# Patient Record
Sex: Female | Born: 1965 | Race: White | Hispanic: No | Marital: Married | Smoking: Current every day smoker
Health system: Southern US, Community
[De-identification: ages and names within clinical notes are randomized; demographics above are authoritative.]

## PROBLEM LIST (undated history)

## (undated) DIAGNOSIS — Z5189 Encounter for other specified aftercare: Secondary | ICD-10-CM

## (undated) DIAGNOSIS — D219 Benign neoplasm of connective and other soft tissue, unspecified: Secondary | ICD-10-CM

## (undated) DIAGNOSIS — D649 Anemia, unspecified: Secondary | ICD-10-CM

## (undated) HISTORY — DX: Anemia, unspecified: D64.9

## (undated) HISTORY — DX: Encounter for other specified aftercare: Z51.89

## (undated) HISTORY — PX: OTHER SURGICAL HISTORY: SHX169

## (undated) HISTORY — PX: BACK SURGERY: SHX140

## (undated) HISTORY — PX: ABDOMINAL HYSTERECTOMY: SHX81

---

## 1997-06-03 ENCOUNTER — Other Ambulatory Visit: Admission: RE | Admit: 1997-06-03 | Discharge: 1997-06-03 | Payer: Self-pay | Admitting: *Deleted

## 1997-07-30 ENCOUNTER — Other Ambulatory Visit: Admission: RE | Admit: 1997-07-30 | Discharge: 1997-07-30 | Payer: Self-pay | Admitting: *Deleted

## 1997-09-01 ENCOUNTER — Other Ambulatory Visit: Admission: RE | Admit: 1997-09-01 | Discharge: 1997-09-01 | Payer: Self-pay | Admitting: *Deleted

## 1999-01-01 ENCOUNTER — Other Ambulatory Visit: Admission: RE | Admit: 1999-01-01 | Discharge: 1999-01-01 | Payer: Self-pay | Admitting: *Deleted

## 2003-09-09 ENCOUNTER — Emergency Department (HOSPITAL_COMMUNITY): Admission: EM | Admit: 2003-09-09 | Discharge: 2003-09-09 | Payer: Self-pay | Admitting: Emergency Medicine

## 2004-04-27 ENCOUNTER — Encounter: Admission: RE | Admit: 2004-04-27 | Discharge: 2004-04-27 | Payer: Self-pay | Admitting: Orthopedic Surgery

## 2004-06-17 ENCOUNTER — Ambulatory Visit: Payer: Self-pay | Admitting: Internal Medicine

## 2004-06-18 ENCOUNTER — Inpatient Hospital Stay (HOSPITAL_COMMUNITY): Admission: RE | Admit: 2004-06-18 | Discharge: 2004-06-22 | Payer: Self-pay | Admitting: Orthopedic Surgery

## 2004-06-18 ENCOUNTER — Encounter (INDEPENDENT_AMBULATORY_CARE_PROVIDER_SITE_OTHER): Payer: Self-pay | Admitting: *Deleted

## 2006-10-15 ENCOUNTER — Encounter: Admission: RE | Admit: 2006-10-15 | Discharge: 2006-10-15 | Payer: Self-pay | Admitting: Orthopedic Surgery

## 2007-02-08 ENCOUNTER — Ambulatory Visit (HOSPITAL_COMMUNITY): Admission: RE | Admit: 2007-02-08 | Discharge: 2007-02-08 | Payer: Self-pay | Admitting: Obstetrics and Gynecology

## 2007-05-06 ENCOUNTER — Emergency Department (HOSPITAL_COMMUNITY): Admission: EM | Admit: 2007-05-06 | Discharge: 2007-05-06 | Payer: Self-pay | Admitting: Emergency Medicine

## 2008-08-30 ENCOUNTER — Encounter: Admission: RE | Admit: 2008-08-30 | Discharge: 2008-08-30 | Payer: Self-pay | Admitting: Orthopedic Surgery

## 2008-09-20 ENCOUNTER — Ambulatory Visit: Payer: Self-pay | Admitting: Diagnostic Radiology

## 2008-09-20 ENCOUNTER — Emergency Department (HOSPITAL_BASED_OUTPATIENT_CLINIC_OR_DEPARTMENT_OTHER): Admission: EM | Admit: 2008-09-20 | Discharge: 2008-09-20 | Payer: Self-pay | Admitting: Emergency Medicine

## 2008-12-17 DIAGNOSIS — Z5189 Encounter for other specified aftercare: Secondary | ICD-10-CM

## 2008-12-17 HISTORY — DX: Encounter for other specified aftercare: Z51.89

## 2008-12-18 ENCOUNTER — Inpatient Hospital Stay (HOSPITAL_COMMUNITY): Admission: AD | Admit: 2008-12-18 | Discharge: 2008-12-26 | Payer: Self-pay | Admitting: Obstetrics & Gynecology

## 2008-12-18 ENCOUNTER — Ambulatory Visit: Payer: Self-pay | Admitting: Obstetrics & Gynecology

## 2008-12-20 ENCOUNTER — Encounter: Payer: Self-pay | Admitting: Family Medicine

## 2008-12-25 ENCOUNTER — Encounter: Payer: Self-pay | Admitting: Interventional Radiology

## 2008-12-26 ENCOUNTER — Ambulatory Visit: Payer: Self-pay | Admitting: Critical Care Medicine

## 2009-01-02 ENCOUNTER — Ambulatory Visit (HOSPITAL_COMMUNITY): Admission: RE | Admit: 2009-01-02 | Discharge: 2009-01-02 | Payer: Self-pay | Admitting: Obstetrics & Gynecology

## 2009-01-07 ENCOUNTER — Ambulatory Visit: Payer: Self-pay | Admitting: Obstetrics & Gynecology

## 2009-01-12 ENCOUNTER — Ambulatory Visit (HOSPITAL_COMMUNITY): Admission: RE | Admit: 2009-01-12 | Discharge: 2009-01-12 | Payer: Self-pay | Admitting: Interventional Radiology

## 2009-08-05 ENCOUNTER — Emergency Department (HOSPITAL_COMMUNITY): Admission: EM | Admit: 2009-08-05 | Discharge: 2009-08-05 | Payer: Self-pay | Admitting: Emergency Medicine

## 2010-02-07 ENCOUNTER — Encounter: Payer: Self-pay | Admitting: Obstetrics & Gynecology

## 2010-04-20 LAB — CBC
HCT: 21.6 % — ABNORMAL LOW (ref 36.0–46.0)
HCT: 25.2 % — ABNORMAL LOW (ref 36.0–46.0)
HCT: 25.2 % — ABNORMAL LOW (ref 36.0–46.0)
Hemoglobin: 8.1 g/dL — ABNORMAL LOW (ref 12.0–15.0)
Hemoglobin: 8.2 g/dL — ABNORMAL LOW (ref 12.0–15.0)
MCHC: 32.2 g/dL (ref 30.0–36.0)
MCHC: 32.5 g/dL (ref 30.0–36.0)
MCHC: 32.6 g/dL (ref 30.0–36.0)
MCHC: 33.4 g/dL (ref 30.0–36.0)
MCV: 72.8 fL — ABNORMAL LOW (ref 78.0–100.0)
MCV: 76.5 fL — ABNORMAL LOW (ref 78.0–100.0)
MCV: 76.9 fL — ABNORMAL LOW (ref 78.0–100.0)
Platelets: 332 10*3/uL (ref 150–400)
Platelets: 407 10*3/uL — ABNORMAL HIGH (ref 150–400)
Platelets: 545 10*3/uL — ABNORMAL HIGH (ref 150–400)
Platelets: 622 10*3/uL — ABNORMAL HIGH (ref 150–400)
RBC: 3.21 MIL/uL — ABNORMAL LOW (ref 3.87–5.11)
RBC: 3.48 MIL/uL — ABNORMAL LOW (ref 3.87–5.11)
RDW: 21.1 % — ABNORMAL HIGH (ref 11.5–15.5)
RDW: 21.1 % — ABNORMAL HIGH (ref 11.5–15.5)
RDW: 23.4 % — ABNORMAL HIGH (ref 11.5–15.5)
WBC: 16.7 10*3/uL — ABNORMAL HIGH (ref 4.0–10.5)
WBC: 8.4 10*3/uL (ref 4.0–10.5)
WBC: 8.7 10*3/uL (ref 4.0–10.5)
WBC: 8.9 10*3/uL (ref 4.0–10.5)

## 2010-04-20 LAB — CULTURE, ROUTINE-ABSCESS
Culture: NO GROWTH
Culture: NO GROWTH

## 2010-04-20 LAB — URINALYSIS, ROUTINE W REFLEX MICROSCOPIC
Bilirubin Urine: NEGATIVE
Nitrite: POSITIVE — AB
Specific Gravity, Urine: 1.015 (ref 1.005–1.030)
Urobilinogen, UA: >8 mg/dL — ABNORMAL HIGH (ref 0.0–1.0)

## 2010-04-20 LAB — CLOSTRIDIUM DIFFICILE EIA
C difficile Toxins A+B, EIA: NEGATIVE
C difficile Toxins A+B, EIA: NEGATIVE

## 2010-04-20 LAB — DIFFERENTIAL
Basophils Absolute: 0 10*3/uL (ref 0.0–0.1)
Basophils Relative: 0 % (ref 0–1)
Basophils Relative: 0 % (ref 0–1)
Lymphs Abs: 0.7 10*3/uL (ref 0.7–4.0)
Monocytes Absolute: 0.7 10*3/uL (ref 0.1–1.0)
Monocytes Absolute: 0.9 10*3/uL (ref 0.1–1.0)
Monocytes Relative: 7 % (ref 3–12)
Neutro Abs: 15.2 10*3/uL — ABNORMAL HIGH (ref 1.7–7.7)
Neutro Abs: 7.5 10*3/uL (ref 1.7–7.7)
Neutrophils Relative %: 91 % — ABNORMAL HIGH (ref 43–77)

## 2010-04-20 LAB — BASIC METABOLIC PANEL
BUN: 6 mg/dL (ref 6–23)
Chloride: 99 mEq/L (ref 96–112)
GFR calc non Af Amer: >60 mL/min (ref >60)
Glucose, Bld: 155 mg/dL — ABNORMAL HIGH (ref 70–99)
Potassium: 3.1 mEq/L — ABNORMAL LOW (ref 3.5–5.1)

## 2010-04-20 LAB — ANAEROBIC CULTURE

## 2010-04-20 LAB — COMPREHENSIVE METABOLIC PANEL
ALT: 12 U/L (ref 0–35)
AST: 15 U/L (ref 0–37)
Albumin: 2.2 g/dL — ABNORMAL LOW (ref 3.5–5.2)
Alkaline Phosphatase: 57 U/L (ref 39–117)
BUN: 9 mg/dL (ref 6–23)
CO2: 25 mEq/L (ref 19–32)
CO2: 32 mEq/L (ref 19–32)
Calcium: 8.3 mg/dL — ABNORMAL LOW (ref 8.4–10.5)
Chloride: 101 mEq/L (ref 96–112)
Chloride: 99 mEq/L (ref 96–112)
GFR calc Af Amer: >60 mL/min (ref >60)
GFR calc non Af Amer: >60 mL/min (ref >60)
GFR calc non Af Amer: >60 mL/min (ref >60)
Glucose, Bld: 143 mg/dL — ABNORMAL HIGH (ref 70–99)
Potassium: 3.8 mEq/L (ref 3.5–5.1)
Sodium: 138 mEq/L (ref 135–145)
Total Bilirubin: 0.5 mg/dL (ref 0.3–1.2)

## 2010-04-20 LAB — URINE MICROSCOPIC-ADD ON

## 2010-04-20 LAB — CROSSMATCH

## 2010-04-20 LAB — PROTIME-INR
INR: 1.46 (ref 0.00–1.49)
Prothrombin Time: 17.6 seconds — ABNORMAL HIGH (ref 11.6–15.2)

## 2010-04-20 LAB — APTT: aPTT: 54 seconds — ABNORMAL HIGH (ref 24–37)

## 2010-04-20 LAB — CULTURE, BLOOD (ROUTINE X 2)
Culture: NO GROWTH
Culture: NO GROWTH

## 2010-04-20 LAB — URINE CULTURE
Colony Count: NO GROWTH
Culture: NO GROWTH

## 2010-06-04 NOTE — Discharge Summary (Signed)
NAMECAMESHIA, CRESSMAN                 ACCOUNT NO.:  [id number]   MEDICAL RECORD NO.:  [id number]          PATIENT TYPE:  INP   LOCATION:  5031                         FACILITY:  [facility]   PHYSICIAN:  Nelda Severe, MD      DATE OF BIRTH:  1965-08-03   DATE OF ADMISSION:  06/18/2004  DATE OF DISCHARGE:  06/22/2004                                 DISCHARGE SUMMARY   HISTORY AND HOSPITAL COURSE:  This woman was admitted for management of  diskogenic pain at L5-S1.  The patient day of admission she was taken to the  operating room where left L5-S1 disk excision was carried out.  Posterior  interbody fusion with cages was carried out as well as posterolateral  fusion.  Pedicle screws and rods were used.  Postoperatively she had no  complications.  She mobilized satisfactorily.  Ileus resolved.   At the time of discharge, she is afebrile.  She is ambulatory with a walker.  She has some dry drainage on the upper portion of her gauze dressing which  is being changed.  She has been instructed she may remove her dressing  tomorrow and shower.  She is to avoid lifting and bending.  She may use a  walker as long as she needs to and her postoperative physical therapy is  walking.   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   CONDITION ON DISCHARGE:  Wound stable, ambulatory.   DISCHARGE MEDICATIONS:  1.  Percocet 5/325 (100 tablets) one to two q.6h. p.r.n.  2.  Flexeril 10 mg one q.6h. p.r.n. spasm, 50 tablets.   DISCHARGE INSTRUCTIONS:  No bending or lifting.  Followup arrangements are  in one month.       MT/MEDQ  D:  06/22/2004  T:  06/22/2004  Job:  [id number]

## 2010-06-04 NOTE — Op Note (Signed)
Gabrielle Walker, [name]                 ACCOUNT NO.:  [id number]   MEDICAL RECORD NO.:  [id number]          PATIENT TYPE:  INP   LOCATION:  2899                         FACILITY:  [facility]   PHYSICIAN:  Nelda Severe, MD      DATE OF BIRTH:  [birth year]   DATE OF PROCEDURE:  06/18/2004  DATE OF DISCHARGE:                                 OPERATIVE REPORT   PREOPERATIVE DIAGNOSIS:  Lumbar disk degeneration and disk herniation, L5-  S1.   POSTOPERATIVE DIAGNOSIS:  Lumbar disk degeneration and disk herniation, L5-  S1.   OPERATION PERFORMED:  Bilateral L5 laminectomy (right L5-S1 for disk  protrusion); posterior interbody fusion at L5-S1; posterolateral fusion at  L5-S1; insertion of pedicle screws at L5-S1 with rods (Synthes ClickX);  placement of interbody cages at L5-S1 (Synthes); application of InFuse (bone  morphogenic protein) to interbody fusion and posterolateral graft; local  harvest of bone graft; neurologic monitoring.   SURGEON:  Nelda Severe, MD   ASSISTANT:  Lynford Citizen, RN   ANESTHESIA:  General endotracheal.   DESCRIPTION OF PROCEDURE:  The patient was placed under general endotracheal  anesthesia.  A Foley catheter was placed in the bladder.  Bilateral  sequential compression devices were attached to the lower extremities.  Electrodes for neurologic monitoring were applied to upper and lower  extremities as well as the scalp.  The patient was positioned prone on an  Acromed frame, positioned on the Brielle table.  The upper extremities were  carefully positioned so as to avoid hyperflexion and abduction of the  shoulders and hyperflexion of the elbows.  The upper extremities were  padded. There was no pressure on the cubital tunnels.  Hips and knees were  gently flexed.  The legs were supported on pillows.   The lumbar area was prepped with DuraPrep and draped in rectangular fashion.  The drapes were secured with Ioban.  A midline incision was made over the  lumbosacral level through a rather elaborate tattoo.  Subcutaneous tissue  and paraspinal fascia was injected with a mixture of 0.25% Marcaine with  epinephrine and 1% plain lidocaine.   Dissection was carried down to the spinous processes and paraspinal muscles  reflected bilaterally from the transverse process of L5 to the ala of the  sacrum.  Cross-table lateral radiograph was taken with the Kocher on the L4  spinous process which confirmed our level superior.   We then first made the holes of pedicles of L5 and L1 Bilaterally.  This was  done in the same manner in each instance.  The piece of the base of the  superior articular process was removed with a Leksell rongeur at its  junction with the pars interarticularis.  The posterior pedicle was  identified and perforated with an awl.  A 3.5 mm drill bit was then used to  make a hole through the pedicle into the vertebral body.  The holes were  injected with FlowSeal and a radiopaque marker was placed in each hole.  A  cross-table lateral radiograph was taken.  Prior to placing the markers,  each  hole was carefully probed with a ball tip probe to make sure it was  circumferentially intact and sounded for the depth and the depth measured.   The cross-table lateral radiograph was somewhat difficult to interpret as it  was not a true lateral at the lower two segments.  It made it appear as  though one of the markers was in the L5-S1 disk space which it appeared not  to be based on careful probing.   Next, we performed a bilateral laminectomy at L5 including of the inferior  facet joints at L5 and the superior facet joints at S1.  When this had been  done, the ligamentum flavum was removed from the dorsal aspect of the cauda  equina and ligamentum flavum and facet joint capsule were removed laterally.  Epidural veins in the neural foramen were controlled using bipolar  coagulation.   Prior to proceeding with the interbody fusion, we  inserted the screws.  These were 6.2 mm diameter screws at the sacrum and L5.  Each hole was  carefully probed again.  A drill bit was placed in the hole and stimulated  and all EMG recordings were at levels above threshold. The screws were then  placed, length based on depth measurements at each level and again  stimulated and again EMG activation distally was at above threshold levels  of current.  Also we had the opportunity to palpate the medial pedicles of  L5 and S1 and could palpate no evidence of perforation.   Next, the dura was retracted medially and annulectomy was performed  bilaterally in sequence.  Enucleation of the disk was then carried out with  disk curettes and rongeurs, etc.  Disk spacer was placed and this suggested  that a 13 mm spacer would be the appropriate thickness.  After careful  curettage of the end plates down to bleeding bone, we then placed a piece of  collagen sponge which had been saturated with BMP anteriorly in the disk  space.  Loose graft was then placed.  We then inserted 13 mm spacers  containing morcellized graft on either side.  This was done well distracting  on the screws so that no forcible impaction had to be carried out.   Not mentioned above is that local graft was harvested by using an acetabular  reamer to ream away partial thickness of the lamina of L5 as well as the  inferior articular processes.  The rest which was removed piecemeal was  morcellized in a bone mill.   Subsequently, the ala of the sacrum bilaterally and the transverse processes  bilaterally were decorticated.  The remaining graft was divided in two and  half packed in on either side.  This was augmented with collagen sponges  soaked with BMP on both sides.   Then we attached the couplings to the Synthes ClickX screws and placed 45 mm  rods which were then coupled to the screws and torqued after compression of  the construct bilaterally.  Radiographs were then taken.   Once again there was problem of obliquity,  making it appear as though one of the screws was in the L5-S1 disk space,  but I am quite confident this is not the case in view of the fact that I  could both visualize the disk space, while it was distracted, and palpate  carefully and there was no evidence of any screw intrusion prior to  insertion of the cages.   Once again there was no stimulation  of nerves by stimulation of the screw at  levels below threshold.  Sponge and needle counts were correct.  Blood loss  was estimated at 250 cc.   The patient is currently awakening in her bed in the operating room and is  moving both lower extremities including dorsiflexing and plantar flexing  both feet and ankles.  There is good dorsiflexion of both right and left  feet as well as plantar flexion and knee flexion bilaterally.       MT/MEDQ  D:  06/18/2004  T:  06/19/2004  Job:  [id number]

## 2010-08-01 ENCOUNTER — Encounter (HOSPITAL_COMMUNITY): Payer: Self-pay | Admitting: *Deleted

## 2010-08-01 ENCOUNTER — Ambulatory Visit (HOSPITAL_COMMUNITY)
Admission: EM | Admit: 2010-08-01 | Discharge: 2010-08-01 | Disposition: A | Discharge status: Home / Self Care | Payer: Medicaid Other | Attending: Obstetrics and Gynecology | Admitting: Obstetrics and Gynecology

## 2010-08-01 ENCOUNTER — Inpatient Hospital Stay (HOSPITAL_COMMUNITY)
Admission: AD | Admit: 2010-08-01 | Discharge: 2010-08-01 | Disposition: A | Discharge status: Home / Self Care | Payer: Medicaid Other | Source: Ambulatory Visit | Attending: Obstetrics and Gynecology | Admitting: Obstetrics and Gynecology

## 2010-08-01 ENCOUNTER — Inpatient Hospital Stay (HOSPITAL_COMMUNITY): Payer: Medicaid Other

## 2010-08-01 DIAGNOSIS — R109 Unspecified abdominal pain: Secondary | ICD-10-CM | POA: Insufficient documentation

## 2010-08-01 DIAGNOSIS — R1032 Left lower quadrant pain: Secondary | ICD-10-CM

## 2010-08-01 DIAGNOSIS — N83299 Other ovarian cyst, unspecified side: Secondary | ICD-10-CM

## 2010-08-01 DIAGNOSIS — N83209 Unspecified ovarian cyst, unspecified side: Secondary | ICD-10-CM | POA: Insufficient documentation

## 2010-08-01 HISTORY — DX: Benign neoplasm of connective and other soft tissue, unspecified: D21.9

## 2010-08-01 LAB — COMPREHENSIVE METABOLIC PANEL
ALT: 15 U/L (ref 0–35)
AST: 16 U/L (ref 0–37)
Albumin: 3.8 g/dL (ref 3.5–5.2)
CO2: 26 mEq/L (ref 19–32)
Chloride: 99 mEq/L (ref 96–112)
GFR calc non Af Amer: >60 mL/min (ref >60)
Potassium: 4.8 mEq/L (ref 3.5–5.1)
Sodium: 136 mEq/L (ref 135–145)
Total Bilirubin: 0.3 mg/dL (ref 0.3–1.2)

## 2010-08-01 LAB — CBC
Platelets: 336 10*3/uL (ref 150–400)
RBC: 5.08 MIL/uL (ref 3.87–5.11)
RDW: 14.1 % (ref 11.5–15.5)
WBC: 13.2 10*3/uL — ABNORMAL HIGH (ref 4.0–10.5)

## 2010-08-01 LAB — URINALYSIS, ROUTINE W REFLEX MICROSCOPIC
Bilirubin Urine: NEGATIVE
Glucose, UA: NEGATIVE mg/dL
Specific Gravity, Urine: <1.005 — ABNORMAL LOW (ref 1.005–1.030)
Urobilinogen, UA: 0.2 mg/dL (ref 0.0–1.0)

## 2010-08-01 LAB — URINE MICROSCOPIC-ADD ON

## 2010-08-01 MED ORDER — GADOBENATE DIMEGLUMINE 529 MG/ML IV SOLN
13.0000 mL | Freq: Once | INTRAVENOUS | Status: AC | PRN
  Administered 2010-08-01: 13 mL via INTRAVENOUS

## 2010-08-01 MED ORDER — KETOROLAC TROMETHAMINE 60 MG/2ML IM SOLN
60.0000 mg | Freq: Once | INTRAMUSCULAR | Status: AC
  Administered 2010-08-01: 60 mg via INTRAMUSCULAR
  Filled 2010-08-01: qty 2

## 2010-08-01 MED ORDER — HYDROCODONE-ACETAMINOPHEN 5-325 MG PO TABS
2.0000 | ORAL_TABLET | ORAL | Status: AC
  Administered 2010-08-01: 2 via ORAL
  Filled 2010-08-01: qty 2

## 2010-08-01 MED ORDER — SODIUM CHLORIDE 0.9 % IJ SOLN
INTRAMUSCULAR | Status: AC
  Administered 2010-08-01: 3 mL
  Filled 2010-08-01: qty 3

## 2010-08-01 MED ORDER — IBUPROFEN 200 MG PO TABS: 800.0000 mg | ORAL_TABLET | Freq: Three times a day (TID) | ORAL | Status: AC | PRN

## 2010-08-01 MED ORDER — HYDROCODONE-ACETAMINOPHEN 5-325 MG PO TABS: 1.0000 | ORAL_TABLET | Freq: Four times a day (QID) | ORAL | Status: AC | PRN

## 2010-08-01 NOTE — ED Provider Notes (Signed)
History     Chief Complaint  Patient presents with  . Abdominal Pain   HPI  LLQ pain for 3 days.  Worsening course.  Hx of partial hysterectomy (still has ovaries) and had a LLQ pelvic abcess following surgery.  States pain today feels much like that in 2010.  Is very worried and tearful.  Surgery was done in [place].    OB History    Grav Para Term Preterm Abortions TAB SAB Ect Mult Living   5 3 3  2  2   3       Past Medical History  Diagnosis Date  . Fibroid tumor     Past Surgical History  Procedure Date  . Abdominal hysterectomy   . Uterine polyps   . Back surgery     Family History  Problem Relation Age of Onset  . Cancer Mother   . Heart disease Father     History  Substance Use Topics  . Smoking status: Current Everyday Smoker -- 1.0 packs/day for 30 years  . Smokeless tobacco: Not on file  . Alcohol Use: No    Allergies: No Known Allergies  Prescriptions prior to admission  Medication Sig Dispense Refill  . HYDROcodone-acetaminophen (NORCO) 5-325 MG per tablet Take 1 tablet by mouth every 6 (six) hours as needed. For pain.         ROS Physical Exam   Blood pressure 129/78, pulse 94, temperature 98.9 F (37.2 C), temperature source Oral, resp. rate 18, height 5\' 7"  (1.702 m), weight 142 lb 9.6 oz (64.683 kg).  Physical Exam  Nursing note and vitals reviewed. Constitutional: She is oriented to person, place, and time. She appears well-developed and well-nourished.  HENT:  Head: Normocephalic.  Eyes: EOM are normal.  Neck: Neck supple.  GI:       Lower abdomen slightly distended in appearance.  At time of exam, client was crying.  Limited exam done and then ultrasound was ready for her.  Neurological: She is alert and oriented to person, place, and time.  Skin: Skin is warm and dry.  Psychiatric:       Anxious and tearful    MAU Course  Procedures  *RADIOLOGY REPORT*  Clinical Data: Left lower quadrant abdominal pain. History of  partial  hysterectomy left pelvic drainages for abscesses in 2010  TRANSABDOMINAL AND TRANSVAGINAL ULTRASOUND OF PELVIS  Technique: Both transabdominal and transvaginal ultrasound  examinations of the pelvis were performed. Transabdominal technique  was performed for global imaging of the pelvis including uterus,  ovaries, adnexal regions, and pelvic cul-de-sac.  Comparison: 01/12/2009  It was necessary to proceed with endovaginal exam following the  transabdomnial exam to visualize the adnexal lesion.  Findings:  Uterus: Surgically absent  Endometrium: Surgically absent  Right ovary: Not visualized  Left ovary: Complex 10.9 x 7.0 by 7.3 cm left adnexal structure  noted, inseparable from left ovarian tissue (which is not readily  recognizable). There appear to be higher and lower echogenicity  components, possibly reflecting a hemorrhagic ovarian cyst, ovarian  mass, or conceivably even a torsed ovary. Doppler assessment was  not performed but given that normal ovarian tissue is difficult to  identify, I am not certain that attempts at Doppler assessment will  be helpful. We do not demonstrate significant flow within the  large left adnexal cystic mass.  Other findings: No free fluid  IMPRESSION:  1. Large complex cystic lesion in the left adnexa, in the expected  vicinity of the left ovary  although normal left ovarian tissue is  not well seen. This could represent a complex/hemorrhagic cyst,  and mass, abscess, or possibly even a torsed ovary. However, given  the lack of associated free fluid, entities such as abscess or  torsed ovary are less favored. If further imaging workup is  required, MRI scan the best chance of providing additional  specificity.  2. Prior hysterectomy.  3. Right ovary was not visualized.  4. No free pelvic fluid identified.   MDM   Consult with Dr. Jolayne Panther. Will get MRI today.  Will need pain management and follow up in GYN clinic. Client has been seen in GYN  clinic previously.  Was managed with drains in pelvic abcess after hysterectomy done in [place] in 2010.  Saw Dr. Penne Lash in GYN clinic in 2010.  2000 - client currently at [facility] for MRI.  Had Vicodin 10/325 prior to transport for pain management.   Nolene Bernheim, NP 08/01/10 2007 ,  MRI showed left adnexal mass, most likely hemorraghic cyst. Per Dr. Jolayne Panther, d/c home with pain meds, follow up in clinic in 4-6 weeks, precautions rev'd.

## 2010-08-01 NOTE — Progress Notes (Addendum)
Onset of abdominal pain in lower abdomen primarily left side has had partial hysterectomy 2010 had abscess and hematoma

## 2010-08-01 NOTE — Progress Notes (Signed)
Pt had partial hysterectomy in 2010, had complications & had inpt stay here after surgery.  Lower abd pain since Friday, pain is more severe, "starting to burn."  Pain is mostly on L side, some dysuria & pressure.

## 2010-08-03 LAB — GC/CHLAMYDIA PROBE AMP, URINE: Chlamydia, Swab/Urine, PCR: NEGATIVE

## 2010-08-28 IMAGING — CR DG CHEST 2V
2 series · 2 of 2 positions shown · non-contrast
Comparison: 09/20/2008

CLINICAL DATA: Postoperative abdominal pain.  Evaluate for pleural
fluid

CHEST - 2 VIEW

[view not recorded (1 of 2)]
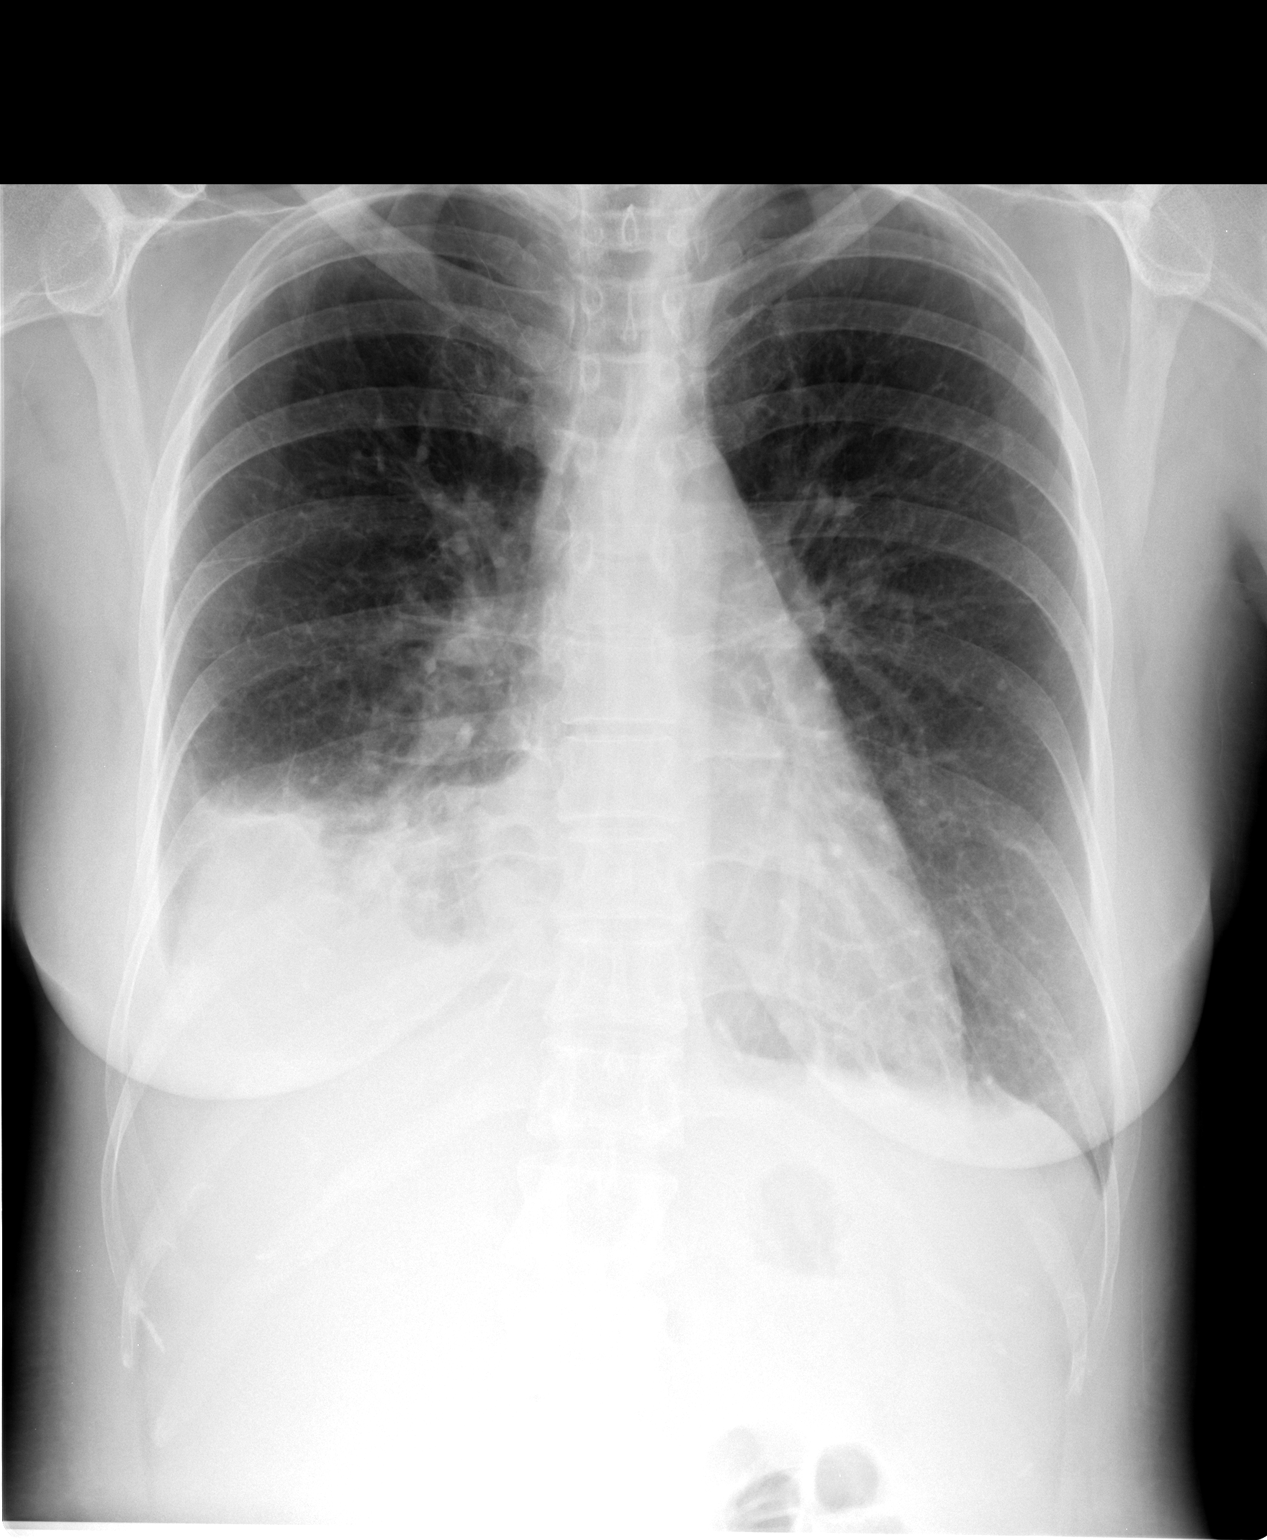

[view not recorded (2 of 2)]
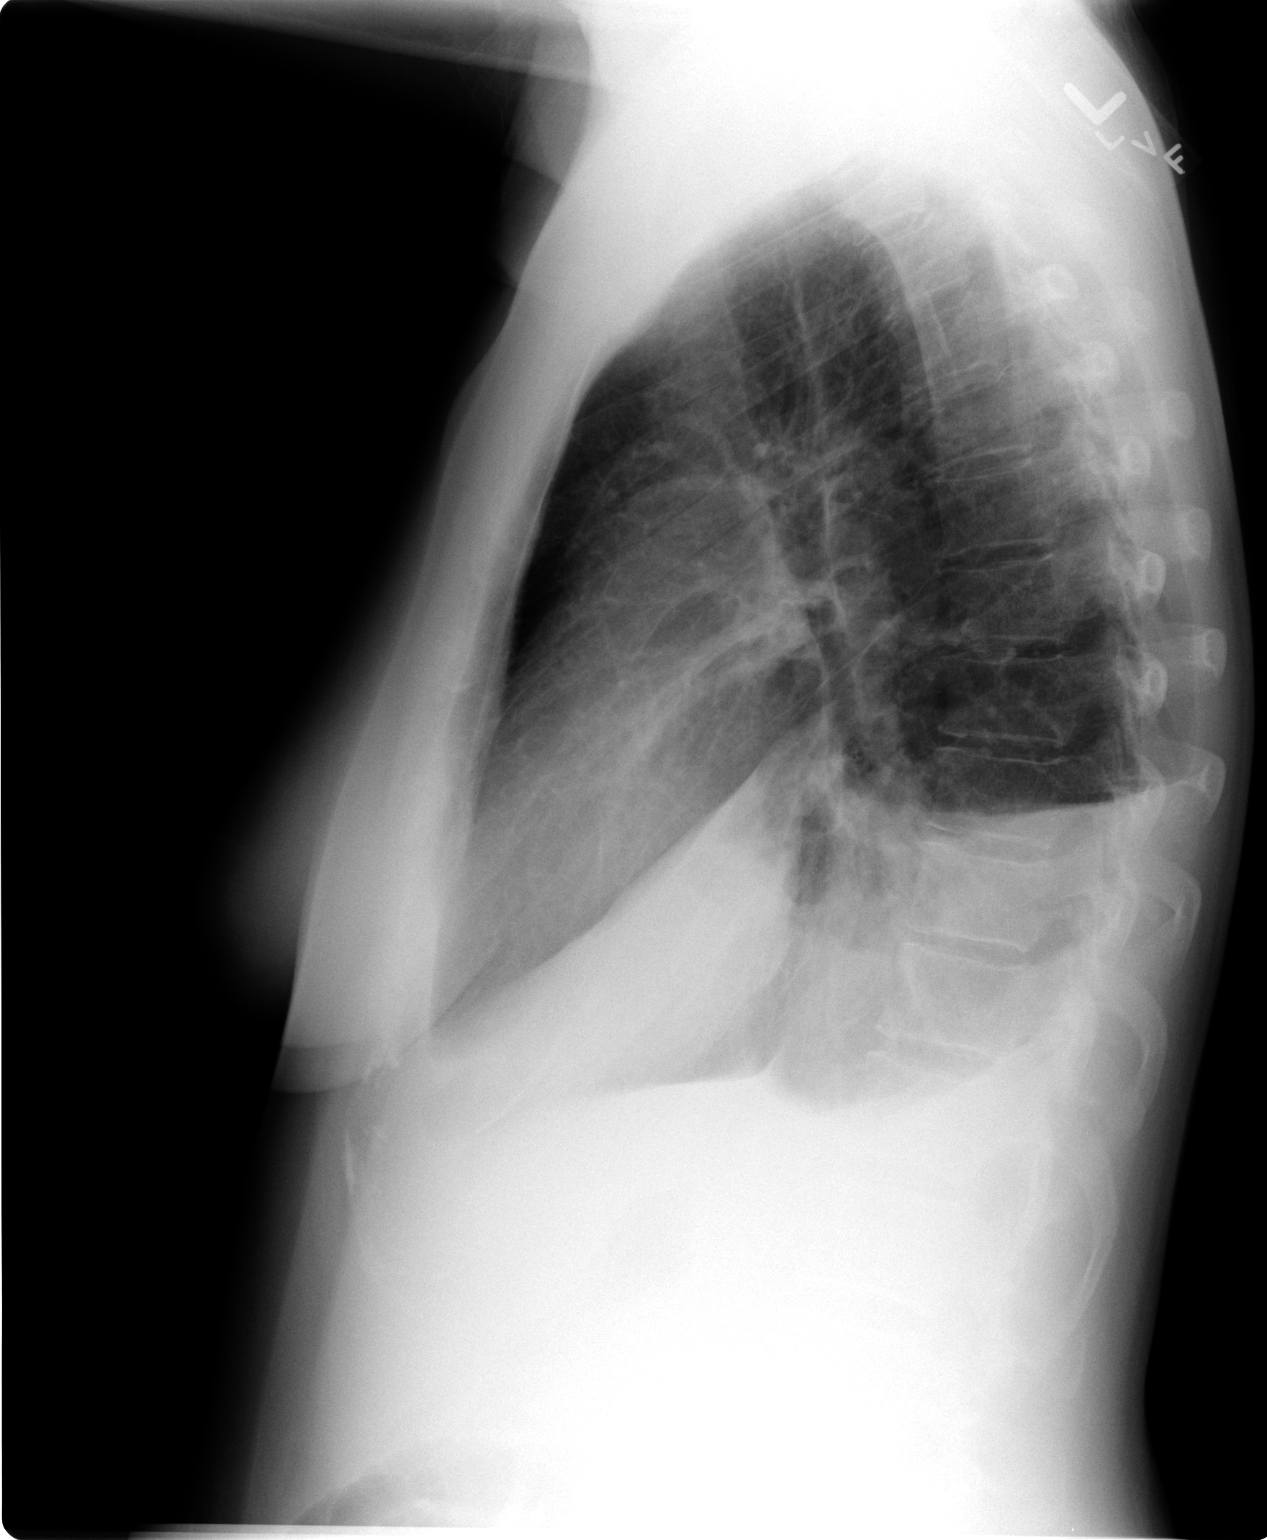

[2 of 2 positions shown; findings below may reference images not displayed]

FINDINGS: Heart and mediastinal contours appear within normal
limits.  There are bilateral pleural effusions right greater than
left noted.  The visualized portion of the lung fields appear clear
with the right base obscured by the described pleural effusion.
This is better evaluated on subsequent decubitus views.  Bony
structures appear intact.
IMPRESSION: Bilateral pleural effusions with obscured right lung base.

## 2010-09-06 ENCOUNTER — Encounter: Payer: Self-pay | Admitting: Obstetrics and Gynecology

## 2010-09-06 ENCOUNTER — Ambulatory Visit (INDEPENDENT_AMBULATORY_CARE_PROVIDER_SITE_OTHER): Payer: Medicaid Other | Admitting: Obstetrics and Gynecology

## 2010-09-06 VITALS — BP 135/92 | HR 84 | Temp 99.1°F | Ht 67.0 in | Wt 143.7 lb

## 2010-09-06 DIAGNOSIS — N83209 Unspecified ovarian cyst, unspecified side: Secondary | ICD-10-CM

## 2010-09-06 MED ORDER — HYDROCODONE-ACETAMINOPHEN 5-500 MG PO TABS: 1.0000 | ORAL_TABLET | ORAL | Status: AC | PRN

## 2010-09-06 NOTE — Progress Notes (Signed)
  Subjective:    Patient ID: Gabrielle Walker, female    DOB: 06-Nov-1965, 44 y.o.   MRN: [id number]  HPI 45 yo [id number] s/p LAVH in November 2010 complicated by pelvic abscess requiring pelvic drain placement by IR presented today as an MAU follow-up. Patient was seen on 7/15 secondary to pelvic pain where an ultrasound and MRI demonstrated the presence of a 10 cm left ovarian cyst. Patient reports that since that time her pain has significantly improved. Patient stated that she experiences the pain in her LLQ with excessive bending forward or during intercourse but it is not as severe as it was during her MAU admission. Patient is hopeful that the cyst has resolved and no further intervention will be needed. Patient is otherwise without any other complaints   Review of Systems  All other systems reviewed and are negative.       Objective:   Physical Exam GENERAL: Well-developed, well-nourished female in no acute distress.  ABDOMEN: Soft, nontender, nondistended. No organomegaly. PELVIC: Normal external female genitalia. Vagina is pink and rugated.  Vaginal vault intact. No palpable masses. Tenderness in LLQ.  EXTREMITIES: No cyanosis, clubbing, or edema, 2+ distal pulses.       Assessment & Plan:  45 yo G5 [id number] with 10 cm left ovarian cyst - In view of improvement of pelvic pain, repeat ultrasound in ordered - Rx. Vicodin provided  - Ca-125 also ordered - Patient counseled on the need for surgical intervention in the event that the cyst is unchanged or increased in size. Patient verbalized understanding. All questions were answered.

## 2010-09-06 NOTE — Patient Instructions (Signed)
Ovarian Cyst   The ovaries are small organs that are on each side of the uterus. The ovaries are the organs that produce the female hormones, estrogen and progesterone. An ovarian cyst is a sac filled with fluid that can vary in its size. It is normal for a small cyst to form in women who are in the childbearing age and who have menstrual periods. This type of cyst is called a follicle cyst that becomes an ovulation cyst (corpus luteum cyst) after it produces the women’s egg. It later goes away on its own if the woman does not become pregnant. There are other kinds of ovarian cysts that may cause problems and may need to be treated. The most serious problem is a cyst with cancer. It should be noted that menopausal women who have an ovarian cyst are at a higher risk of it being a cancer cyst. They should be evaluated very quickly, thoroughly and followed closely. This is especially true in menopausal women because of the high rate of ovarian cancer in women in menopause.   CAUSES AND TYPES OF OVARIAN CYSTS:   FUNCTIONAL CYST: The follicle/corpus luteum cyst is a functional cyst that occurs every month during ovulation with the menstrual cycle. They go away with the next menstrual cycle if the woman does not get pregnant. Usually, there are no symptoms with a functional cyst.   ENDOMETRIOMA CYST: This cyst develops from the lining of the uterus tissue. This cyst gets in or on the ovary. It grows every month from the bleeding during the menstrual period. It is also called a “chocolate cyst” because it becomes filled with blood that turns brown. This cyst can cause pain in the lower abdomen during intercourse and with your menstrual period.   CYSTADENOMA CYST: This cyst develops from the cells on the outside of the ovary. They usually are not cancerous. They can get very big and cause lower abdomen pain and pain with intercourse. This type of cyst can twist on itself, cut off its blood supply and cause severe pain. It  also can easily rupture and cause a lot of pain.   DERMOID CYST: This type of cyst is sometimes found in both ovaries. They are found to have different kinds of body tissue in the cyst. The tissue includes skin, teeth, hair, and/or cartilage. They usually do not have symptoms unless they get very big. Dermoid cysts are rarely cancerous.   POLYCYSTIC OVARY: This is a rare condition with hormone problems that produces many small cysts on both ovaries. The cysts are follicle-like cysts that never produce an egg and become a corpus luteum. It can cause an increase in body weight, infertility, acne, increase in body and facial hair and lack of menstrual periods or rare menstrual periods. Many women with this problem develop type 2 diabetes. The exact cause of this problem is unknown. A polycystic ovary is rarely cancerous.   THECA LUTEIN CYST: Occurs when too much hormone (human chorionic gonadotropin) is produced and over-stimulates the ovaries to produce an egg. They are frequently seen when doctors stimulate the ovaries for invitro-fertilization (test tube babies).   LUTEOMA CYST: This cyst is seen during pregnancy. Rarely it can cause an obstruction to the birth canal during labor and delivery. They usually go away after delivery.   SYMPTOMS   Pelvic pain or pressure.   Pain during sexual intercourse.   Increasing girth (swelling) of the abdomen.   Abnormal menstrual periods.   Increasing pain with menstrual   periods.   You stop having menstrual periods and you are not pregnant.   DIAGNOSIS   The diagnosis can be made during:   Routine or annual pelvic examination (common).   Ultrasound.   X-ray of the pelvis.   CT Scan.   MRI.   Blood tests.   TREATMENT   Treatment may only be to follow the cyst monthly for 2 to 3 months with your caregiver. Many go away on their own, especially functional cysts.   May be aspirated (drained) with a long needle with ultrasound, or by laparoscopy (inserting a tube into the pelvis  through a small incision).   The whole cyst can be removed by laparoscopy.   Sometimes the cyst may need to be removed through an incision in the lower abdomen.   Hormone treatment is sometimes used to help dissolve certain cysts.   Birth control pills are sometimes used to help dissolve certain cysts.   HOME CARE INSTRUCTIONS   Follow your caregiver's advice regarding:   Medicine.   Follow up visits to evaluate and treat the cyst.   You may need to come back or make an appointment with another caregiver, to find the exact cause of your cyst, if your caregiver is not a gynecologist.   Get your yearly and recommended pelvic examinations and Pap tests.   Let your caregiver know if you have had an ovarian cyst in the past.   SEEK MEDICAL CARE IF:   Your periods are late, irregular, they stop, or are painful.   Your stomach (abdomen) or pelvic pain does not go away.   Your stomach becomes larger or swollen.   You have pressure on your bladder or trouble emptying your bladder completely.   You have painful sexual intercourse.   You have feelings of fullness, pressure, or discomfort in your stomach.   You lose weight for no apparent reason.   You feel generally ill.   You become constipated.   You lose your appetite.   You develop acne.   You have an increase in body and facial hair.   You are gaining weight, without changing your exercise and eating habits.   You think you are pregnant.   SEEK IMMEDIATE MEDICAL CARE IF:   You have increasing abdominal pain.   You feel sick to your stomach (nausea) and/or vomit.   You develop a fever that comes on suddenly.   You develop abdominal pain during a bowel movement.   Your menstrual periods become heavier than usual.   Document Released: 01/03/2005 Document Re-Released: 12/22/2008   [employer]® Patient Information ©2011 [employer].

## 2010-09-08 ENCOUNTER — Ambulatory Visit (HOSPITAL_COMMUNITY)
Admission: RE | Admit: 2010-09-08 | Discharge: 2010-09-08 | Disposition: A | Discharge status: Home / Self Care | Payer: Medicaid Other | Source: Ambulatory Visit | Attending: Obstetrics and Gynecology | Admitting: Obstetrics and Gynecology

## 2010-09-08 DIAGNOSIS — N83209 Unspecified ovarian cyst, unspecified side: Secondary | ICD-10-CM | POA: Insufficient documentation

## 2010-09-14 IMAGING — CT CT PELVIS W/ CM
2 of 3 series · 17 of 46 positions shown, 19 images · IV contrast (APPLIED)
Comparison: 01/02/2009

CLINICAL DATA: Pelvic abscess, status post hysterectomy,
percutaneous drainage

CT PELVIS WITH CONTRAST
TECHNIQUE: Multidetector CT imaging of the pelvis was performed
using the standard protocol following the bolus administration of
intravenous contrast.
Contrast:  100 ml Pmnipaque-HZZ

[Series 2: pelvis 5.0 b31f · axial · 0.74mm/px · z∈[-615,-420]mm · 14 of 45 slices shown, 16 images]
[im 3/45  soft-tissue]
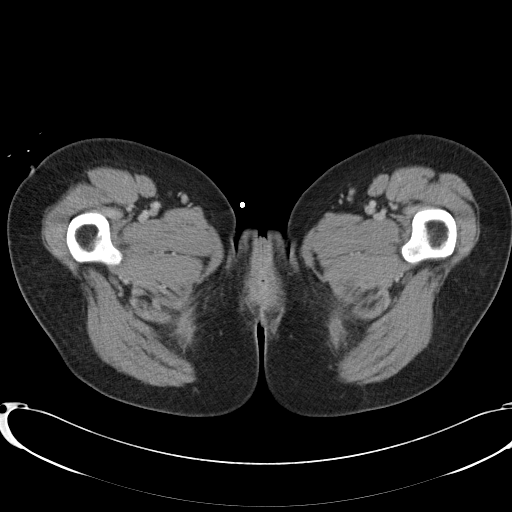
[im 3/45  bone]
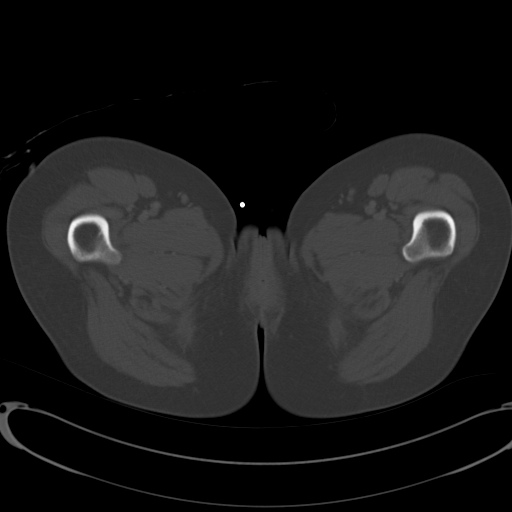
[im 6/45  soft-tissue]
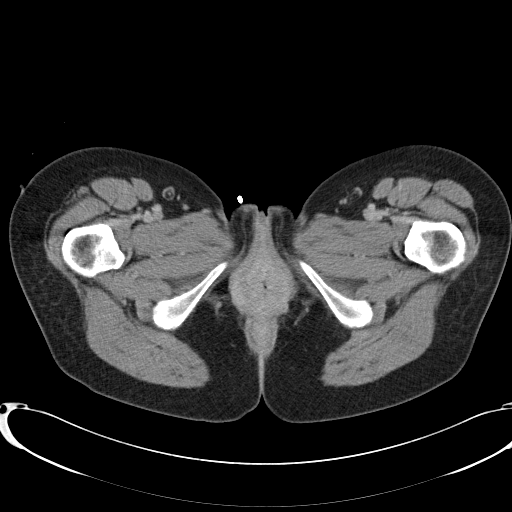
[im 9/45  soft-tissue]
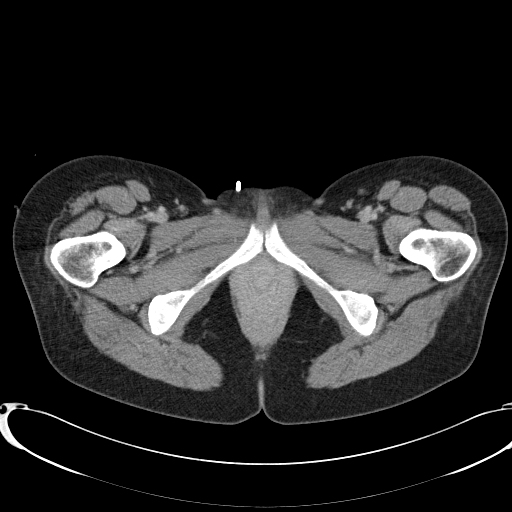
[im 12/45  soft-tissue]
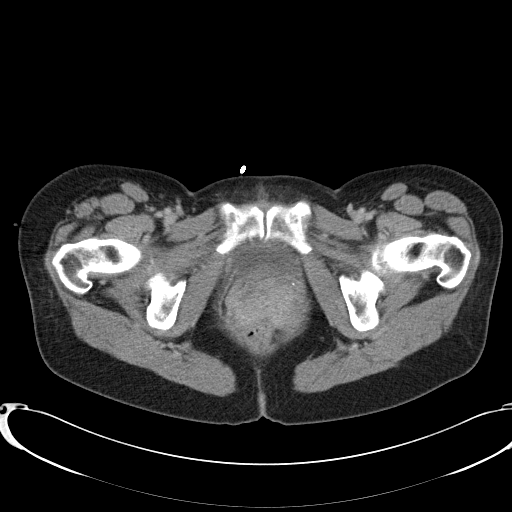
[im 15/45  soft-tissue]
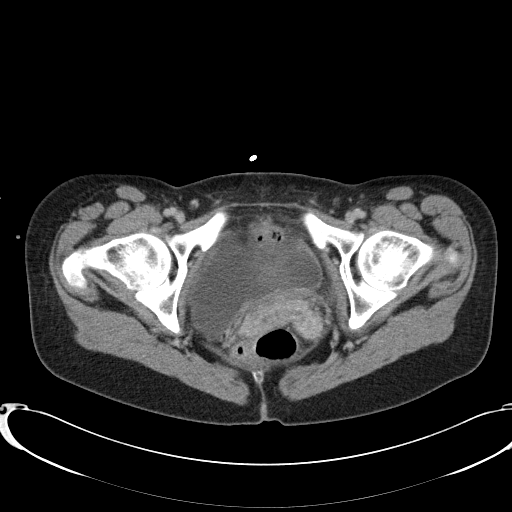
[im 18/45  soft-tissue]
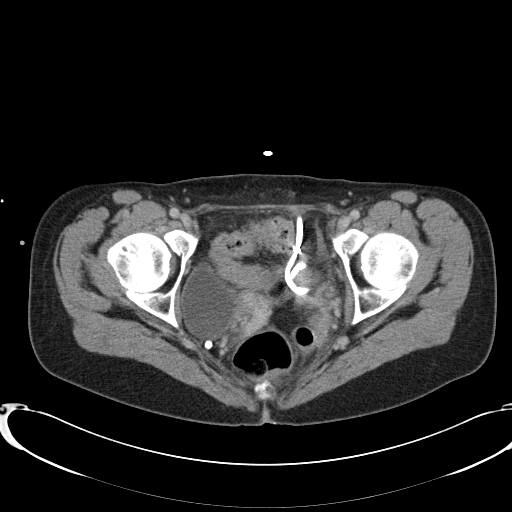
[im 20/45  soft-tissue]
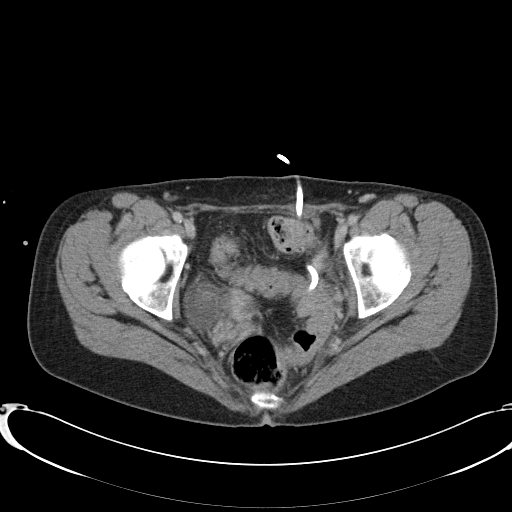
[im 25/45  soft-tissue]
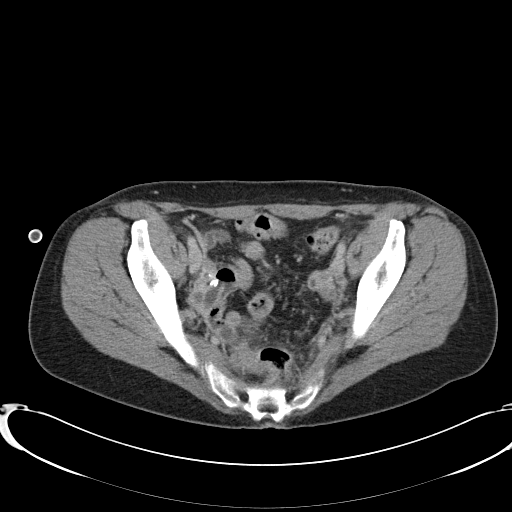
[im 27/45  soft-tissue]
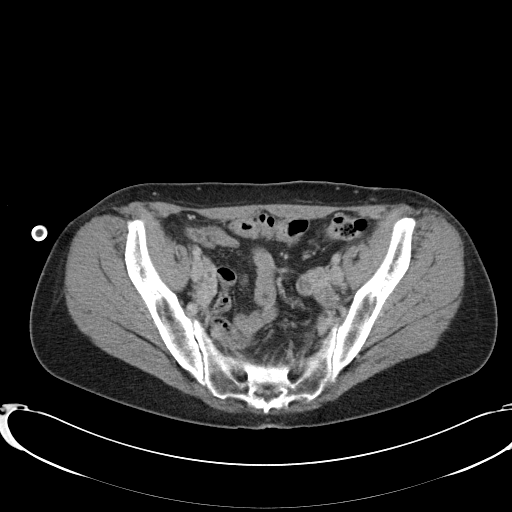
[im 27/45  bone]
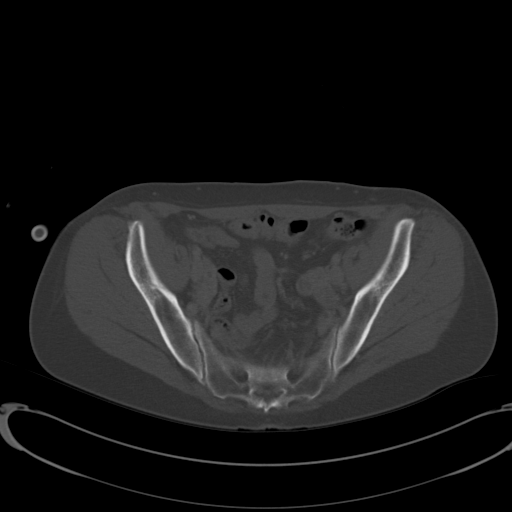
[im 30/45  soft-tissue]
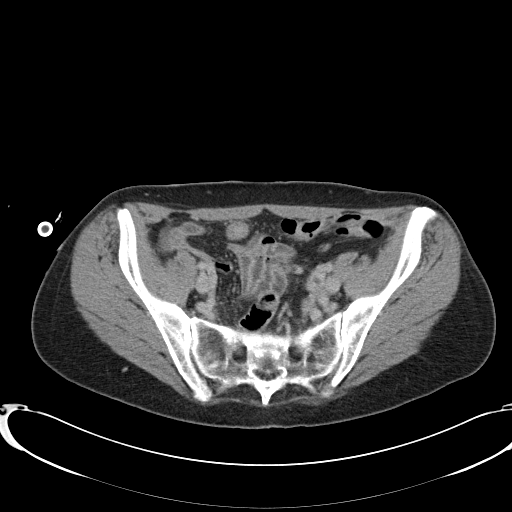
[im 33/45  soft-tissue]
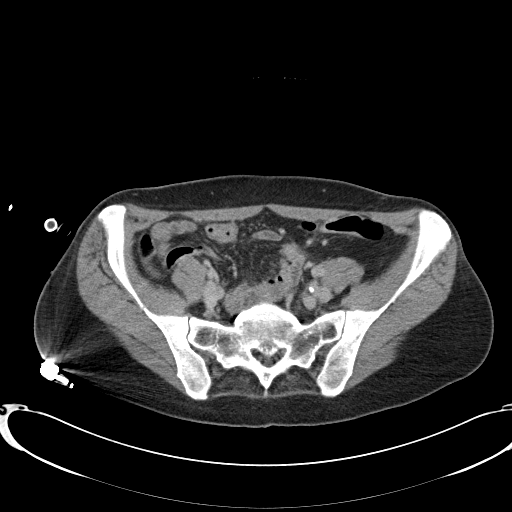
[im 36/45  soft-tissue]
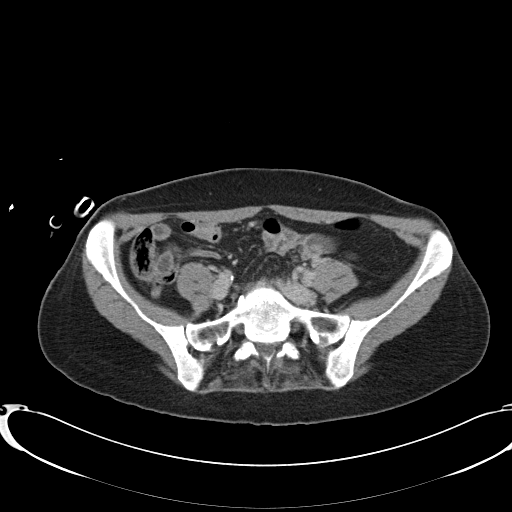
[im 39/45  soft-tissue]
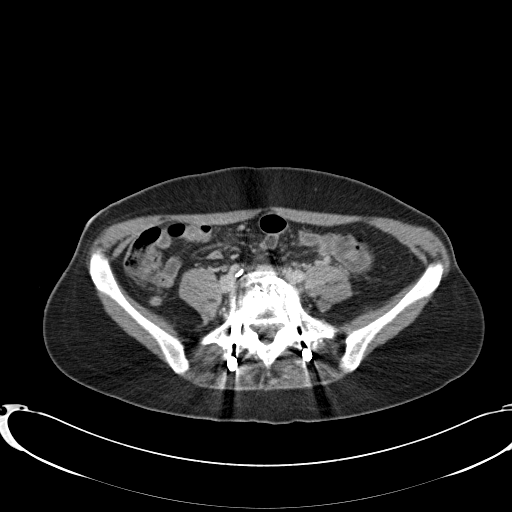
[im 42/45  soft-tissue]
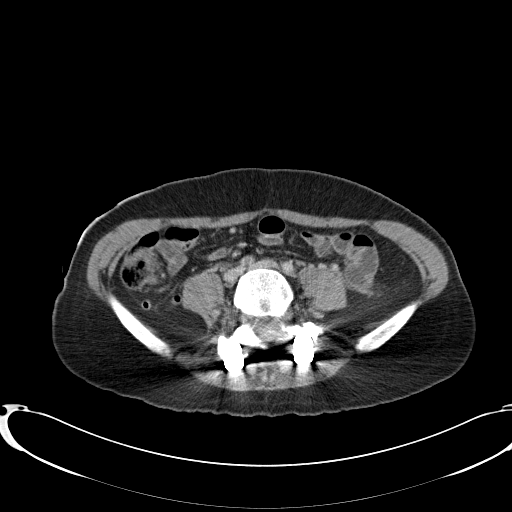

[Series 602: coronal pelvis · coronal · 0.74mm/px · 3 of 62 slices shown]
[im 21/62  soft-tissue]
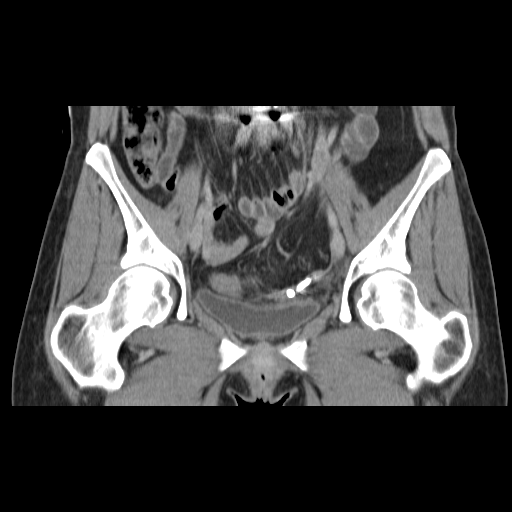
[im 28/62  soft-tissue]
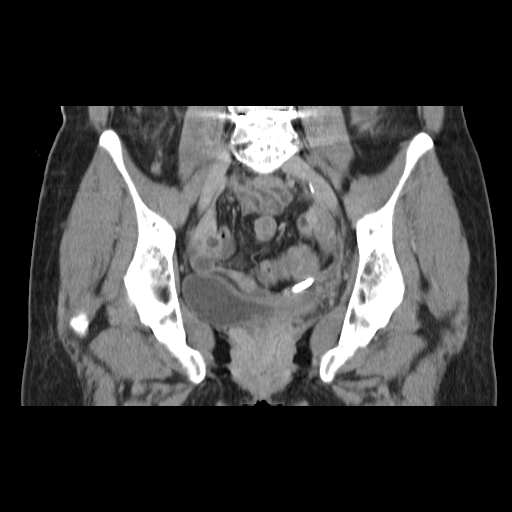
[im 34/62  soft-tissue]
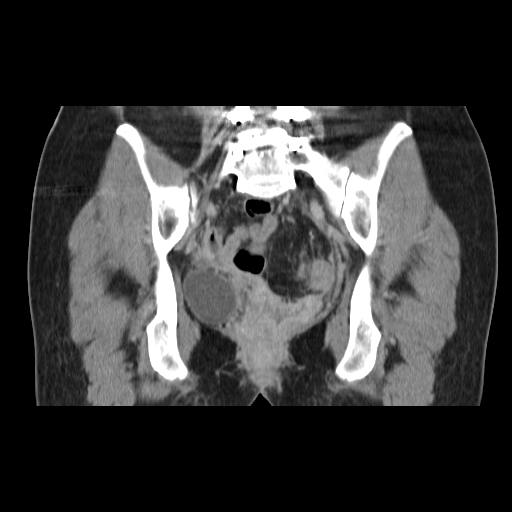

[17 of 46 positions shown; findings below may reference images not displayed]

FINDINGS: Interval removal of one of the left pelvic drains.  An
anterior left pelvic drain remains just superior to the bladder.
The previous small residual fluid collection has resolved at the
drainage catheter site.  No residual fluid collection or new
developing abscess identified.  No change in catheter position.
The patient is status post hysterectomy.  Small ovarian
cysts/follicles noted bilaterally.  No acute distal bowel process.
No pelvic free fluid.  Normal urinary bladder.  Postop changes of
the lower lumbar spine.  Minimal atherosclerosis of the iliac
vessels.
IMPRESSION: Resolved left anterior pelvic fluid collection at the drainage
catheter site.
No new pelvic collection identified.
Status post hysterectomy.

## 2010-10-01 ENCOUNTER — Ambulatory Visit (INDEPENDENT_AMBULATORY_CARE_PROVIDER_SITE_OTHER): Payer: Medicaid Other | Admitting: Obstetrics and Gynecology

## 2010-10-01 ENCOUNTER — Encounter: Payer: Self-pay | Admitting: Obstetrics and Gynecology

## 2010-10-01 VITALS — BP 114/72 | HR 83 | Temp 98.7°F | Ht 67.0 in | Wt 144.8 lb

## 2010-10-01 DIAGNOSIS — N839 Noninflammatory disorder of ovary, fallopian tube and broad ligament, unspecified: Secondary | ICD-10-CM

## 2010-10-01 DIAGNOSIS — N838 Other noninflammatory disorders of ovary, fallopian tube and broad ligament: Secondary | ICD-10-CM

## 2010-10-01 NOTE — Progress Notes (Signed)
The patient is a 45 year old gravida 5 para 30-3 was admitted to [facility] in December 2010 for complications following laparoscopic-assisted vaginal hysterectomy at another hospital. She developed a pelvic abscess which needed draining and had a fairly rough time. Last month she developed the same pain in the left lower quadrant that she noticed prior to her abscess and she went into the [facility]. he did an ultrasound at that time and found left adnexal mass that was followed up by a second one in mid August it showed the disagreed douched significantly in size. Probably representing a hemorrhagic cyst with possibility of hydrosalpinx. At present time she came in for followup is relatively comfortable she has some pain with intercourse which she says she may have delivered with. I told her we should get a followup in about 3 months which would be mid-November on the ultrasound but in the meantime the pain got worse please come back in if at that time it small or stable and she can live with the pain we'll do nothing if the pain becomes worse or gets to the point that it is interfering with her life and removal may be necessary.  Impression: Left adnexal mass to followup in November.

## 2010-10-12 LAB — CBC
HCT: 40.9
Hemoglobin: 14.2
MCHC: 34.7
RBC: 5.21 — ABNORMAL HIGH
RDW: 17.4 — ABNORMAL HIGH

## 2010-10-12 LAB — POCT I-STAT, CHEM 8
Calcium, Ion: 1.2
Creatinine, Ser: 0.7
Hemoglobin: 15.3 — ABNORMAL HIGH
Sodium: 139
TCO2: 25

## 2010-10-12 LAB — POCT CARDIAC MARKERS
Myoglobin, poc: 21.4
Operator id: 285491
Troponin i, poc: <0.05

## 2010-10-12 LAB — DIFFERENTIAL
Lymphocytes Relative: 21
Lymphs Abs: 3
Neutrophils Relative %: 71

## 2010-12-06 ENCOUNTER — Other Ambulatory Visit: Payer: Self-pay | Admitting: Obstetrics and Gynecology

## 2010-12-06 ENCOUNTER — Ambulatory Visit (HOSPITAL_COMMUNITY)
Admission: RE | Admit: 2010-12-06 | Discharge: 2010-12-06 | Disposition: A | Discharge status: Home / Self Care | Payer: Medicaid Other | Source: Ambulatory Visit | Attending: Obstetrics and Gynecology | Admitting: Obstetrics and Gynecology

## 2010-12-06 DIAGNOSIS — N9489 Other specified conditions associated with female genital organs and menstrual cycle: Secondary | ICD-10-CM | POA: Insufficient documentation

## 2010-12-06 DIAGNOSIS — N838 Other noninflammatory disorders of ovary, fallopian tube and broad ligament: Secondary | ICD-10-CM

## 2013-11-18 ENCOUNTER — Encounter: Payer: Self-pay | Admitting: Obstetrics and Gynecology

## 2013-12-15 ENCOUNTER — Emergency Department (HOSPITAL_COMMUNITY)
Admission: EM | Admit: 2013-12-15 | Discharge: 2013-12-15 | Disposition: A | Discharge status: Home / Self Care | Payer: Medicaid Other | Attending: Emergency Medicine | Admitting: Emergency Medicine

## 2013-12-15 ENCOUNTER — Encounter (HOSPITAL_COMMUNITY): Payer: Self-pay | Admitting: Emergency Medicine

## 2013-12-15 ENCOUNTER — Emergency Department (HOSPITAL_COMMUNITY): Payer: Medicaid Other

## 2013-12-15 DIAGNOSIS — M546 Pain in thoracic spine: Secondary | ICD-10-CM | POA: Insufficient documentation

## 2013-12-15 DIAGNOSIS — R079 Chest pain, unspecified: Secondary | ICD-10-CM | POA: Insufficient documentation

## 2013-12-15 DIAGNOSIS — R05 Cough: Secondary | ICD-10-CM | POA: Insufficient documentation

## 2013-12-15 DIAGNOSIS — Z862 Personal history of diseases of the blood and blood-forming organs and certain disorders involving the immune mechanism: Secondary | ICD-10-CM | POA: Insufficient documentation

## 2013-12-15 DIAGNOSIS — R059 Cough, unspecified: Secondary | ICD-10-CM

## 2013-12-15 DIAGNOSIS — Z8742 Personal history of other diseases of the female genital tract: Secondary | ICD-10-CM | POA: Insufficient documentation

## 2013-12-15 DIAGNOSIS — M62838 Other muscle spasm: Secondary | ICD-10-CM | POA: Insufficient documentation

## 2013-12-15 DIAGNOSIS — M545 Low back pain: Secondary | ICD-10-CM | POA: Insufficient documentation

## 2013-12-15 DIAGNOSIS — Z720 Tobacco use: Secondary | ICD-10-CM | POA: Insufficient documentation

## 2013-12-15 LAB — BASIC METABOLIC PANEL
ANION GAP: 13 (ref 5–15)
BUN: 10 mg/dL (ref 6–23)
CHLORIDE: 98 meq/L (ref 96–112)
CO2: 25 meq/L (ref 19–32)
CREATININE: 0.81 mg/dL (ref 0.50–1.10)
Calcium: 10.2 mg/dL (ref 8.4–10.5)
GFR calc Af Amer: >90 mL/min (ref >90)
GFR calc non Af Amer: 84 mL/min — ABNORMAL LOW (ref >90)
GLUCOSE: 105 mg/dL — AB (ref 70–99)
Potassium: 4.5 mEq/L (ref 3.7–5.3)
Sodium: 136 mEq/L — ABNORMAL LOW (ref 137–147)

## 2013-12-15 LAB — CBC
HEMATOCRIT: 47.1 % — AB (ref 36.0–46.0)
HEMOGLOBIN: 16 g/dL — AB (ref 12.0–15.0)
MCH: 29.1 pg (ref 26.0–34.0)
MCHC: 34 g/dL (ref 30.0–36.0)
MCV: 85.6 fL (ref 78.0–100.0)
Platelets: 293 10*3/uL (ref 150–400)
RBC: 5.5 MIL/uL — ABNORMAL HIGH (ref 3.87–5.11)
RDW: 13.8 % (ref 11.5–15.5)
WBC: 9 10*3/uL (ref 4.0–10.5)

## 2013-12-15 LAB — I-STAT TROPONIN, ED: Troponin i, poc: 0 ng/mL (ref 0.00–0.08)

## 2013-12-15 MED ORDER — DIAZEPAM 5 MG PO TABS: 5.0000 mg | ORAL_TABLET | Freq: Four times a day (QID) | ORAL | Status: DC | PRN

## 2013-12-15 MED ORDER — HYDROMORPHONE HCL 1 MG/ML IJ SOLN
1.0000 mg | Freq: Once | INTRAMUSCULAR | Status: AC
  Administered 2013-12-15: 1 mg via INTRAMUSCULAR
  Filled 2013-12-15: qty 1

## 2013-12-15 MED ORDER — OXYCODONE-ACETAMINOPHEN 5-325 MG PO TABS
2.0000 | ORAL_TABLET | Freq: Once | ORAL | Status: DC
  Filled 2013-12-15: qty 2

## 2013-12-15 NOTE — ED Notes (Signed)
Pt from home c/o left neck, shoulder, and chest pain that started a few weeks ago. Pt reports that it is painful to touch and take a deep breath. She feels as if there is inflammation. She reports nausea for a few hours today.

## 2013-12-15 NOTE — ED Provider Notes (Signed)
CSN: [id number]     Arrival date & time 12/15/13  1419 History   First MD Initiated Contact with Patient 12/15/13 1546     Chief Complaint  Patient presents with  . Neck Pain  . Shoulder Pain  . Chest Pain     (Consider location/radiation/quality/duration/timing/severity/associated sxs/prior Treatment) Patient is a 48 y.o. female presenting with neck pain, shoulder pain, and chest pain.  Neck Pain Pain location:  L side Quality:  Aching and shooting Pain radiates to:  Does not radiate Pain severity:  Moderate Pain is:  Same all the time Onset quality:  Gradual Duration:  2 days Timing:  Constant Progression:  Worsening Chronicity:  New Context: not fall, not MCA and not recent injury   Context comment:  Prior ruptured disc Relieved by:  Nothing Worsened by:  Bending (palpation, twisting) Associated symptoms: chest pain   Shoulder Pain Associated symptoms: neck pain   Chest Pain   Past Medical History  Diagnosis Date  . Fibroid tumor   . Blood transfusion dec 2010  . Anemia    Past Surgical History  Procedure Laterality Date  . Abdominal hysterectomy    . Uterine polyps    . Back surgery     Family History  Problem Relation Age of Onset  . Cancer Mother   . Heart disease Father    History  Substance Use Topics  . Smoking status: Current Every Day Smoker -- 1.00 packs/day for 30 years    Types: Cigarettes  . Smokeless tobacco: Not on file  . Alcohol Use: No   OB History    Gravida Para Term Preterm AB TAB SAB Ectopic Multiple Living   5 3 3  2  2   3      Review of Systems  Cardiovascular: Positive for chest pain.  Musculoskeletal: Positive for neck pain.  All other systems reviewed and are negative.     Allergies  Review of patient's allergies indicates no known allergies.  Home Medications   Prior to Admission medications   Medication Sig Start Date End Date Taking? Authorizing Provider  Acetaminophen (PAIN RELIEVER EXTRA STRENGTH PO) Take  1,000 mg by mouth every 6 (six) hours as needed (pain).   Yes Historical Provider, MD  diphenhydrAMINE (BENADRYL) 25 MG tablet Take 25 mg by mouth every 6 (six) hours as needed for itching or sleep (itching in sleep).   Yes Historical Provider, MD  diazepam (VALIUM) 5 MG tablet Take 1 tablet (5 mg total) by mouth every 6 (six) hours as needed (spasms). 12/15/13   Debby Freiberg, MD  diphenhydramine-acetaminophen (TYLENOL PM) 25-500 MG TABS Take 1 tablet by mouth at bedtime as needed (sleep).     Historical Provider, MD   BP 111/68 mmHg  Pulse 71  Temp(Src) 98.1 F (36.7 C) (Oral)  Resp 16  SpO2 97% Physical Exam  Constitutional: She is oriented to person, place, and time. She appears well-developed and well-nourished.  HENT:  Head: Normocephalic and atraumatic.  Right Ear: External ear normal.  Left Ear: External ear normal.  Eyes: Conjunctivae and EOM are normal. Pupils are equal, round, and reactive to light.  Neck: Normal range of motion. Neck supple.  Cardiovascular: Normal rate, regular rhythm, normal heart sounds and intact distal pulses.   Pulmonary/Chest: Effort normal and breath sounds normal.  Abdominal: Soft. Bowel sounds are normal. There is no tenderness.  Musculoskeletal: Normal range of motion.       Cervical back: She exhibits tenderness and bony tenderness.  Thoracic back: She exhibits tenderness and bony tenderness.       Lumbar back: She exhibits tenderness and bony tenderness.  Neurological: She is alert and oriented to person, place, and time. She has normal strength. No cranial nerve deficit or sensory deficit. GCS eye subscore is 4. GCS verbal subscore is 5. GCS motor subscore is 6.  Skin: Skin is warm and dry.  Vitals reviewed.   ED Course  Procedures (including critical care time) Labs Review Labs Reviewed  BASIC METABOLIC PANEL - Abnormal; Notable for the following:    Sodium 136 (*)    Glucose, Bld 105 (*)    GFR calc non Af Amer 84 (*)    All  other components within normal limits  CBC - Abnormal; Notable for the following:    RBC 5.50 (*)    Hemoglobin 16.0 (*)    HCT 47.1 (*)    All other components within normal limits  Randolm Idol, ED    Imaging Review Dg Chest 2 View  12/15/2013   CLINICAL DATA:  Nonproductive cough.  EXAM: CHEST  2 VIEW  COMPARISON:  Chest x-rays dated 12/26/2008  FINDINGS: The heart size and mediastinal contours are within normal limits. Both lungs are clear. The visualized skeletal structures are unremarkable.  IMPRESSION: Normal chest.   Electronically Signed   By: Rozetta Nunnery M.D.   On: 12/15/2013 17:18     EKG Interpretation   Date/Time:  Sunday December 15 2013 14:30:10 EST Ventricular Rate:  80 PR Interval:  154 QRS Duration: 88 QT Interval:  398 QTC Calculation: 459 R Axis:   80 Text Interpretation:  Sinus rhythm No significant change since last  tracing Confirmed by Debby Freiberg [phone]) on 12/15/2013 3:54:37 PM      MDM   Final diagnoses:  Cough  Neck muscle spasm    48 y.o. female with pertinent PMH of prior ruptured disc of lumbar spine presents with atraumatic neck pain radiating to shoulder and chest.  No fevers, however pt has had cough over same time period.  Chest pain is shooting only from neck, and pt denies recent change in severity over the last 48 hours.  Physical exam today as above with exact reproduction of pain and symptoms with palpation and rotation of the neck.  No recent falls or trauma to suggest need for plain films, and pt has no weakness or sensory loss in arms indicative of cord pathology.  CXR, troponin negative.  Likely torticollis and strain.  DC home in stable condition with standard return precautions.  1. Neck muscle spasm   2. Cough         Debby Freiberg, MD 12/15/13 1729

## 2013-12-15 NOTE — Discharge Instructions (Signed)

## 2018-01-07 ENCOUNTER — Emergency Department (HOSPITAL_COMMUNITY)
Admission: EM | Admit: 2018-01-07 | Discharge: 2018-01-07 | Disposition: A | Discharge status: Home / Self Care | Payer: Self-pay | Attending: Emergency Medicine | Admitting: Emergency Medicine

## 2018-01-07 ENCOUNTER — Emergency Department (HOSPITAL_COMMUNITY): Payer: Self-pay

## 2018-01-07 ENCOUNTER — Encounter (HOSPITAL_COMMUNITY): Payer: Self-pay | Admitting: Emergency Medicine

## 2018-01-07 ENCOUNTER — Other Ambulatory Visit: Payer: Self-pay

## 2018-01-07 DIAGNOSIS — J40 Bronchitis, not specified as acute or chronic: Secondary | ICD-10-CM | POA: Insufficient documentation

## 2018-01-07 DIAGNOSIS — F17210 Nicotine dependence, cigarettes, uncomplicated: Secondary | ICD-10-CM | POA: Insufficient documentation

## 2018-01-07 DIAGNOSIS — R509 Fever, unspecified: Secondary | ICD-10-CM

## 2018-01-07 LAB — COMPREHENSIVE METABOLIC PANEL
ALBUMIN: 4.2 g/dL (ref 3.5–5.0)
ALK PHOS: 73 U/L (ref 38–126)
ALT: 56 U/L — ABNORMAL HIGH (ref 0–44)
ANION GAP: 12 (ref 5–15)
AST: 41 U/L (ref 15–41)
BILIRUBIN TOTAL: 0.2 mg/dL — AB (ref 0.3–1.2)
BUN: 14 mg/dL (ref 6–20)
CALCIUM: 9.3 mg/dL (ref 8.9–10.3)
CO2: 25 mmol/L (ref 22–32)
Chloride: 100 mmol/L (ref 98–111)
Creatinine, Ser: 0.9 mg/dL (ref 0.44–1.00)
GFR calc non Af Amer: >60 mL/min (ref >60)
Glucose, Bld: 114 mg/dL — ABNORMAL HIGH (ref 70–99)
POTASSIUM: 4.9 mmol/L (ref 3.5–5.1)
SODIUM: 137 mmol/L (ref 135–145)
TOTAL PROTEIN: 7.5 g/dL (ref 6.5–8.1)

## 2018-01-07 LAB — CBC WITH DIFFERENTIAL/PLATELET
ABS IMMATURE GRANULOCYTES: 0.08 10*3/uL — AB (ref 0.00–0.07)
BASOS PCT: 0 %
Basophils Absolute: 0 10*3/uL (ref 0.0–0.1)
EOS ABS: 0 10*3/uL (ref 0.0–0.5)
Eosinophils Relative: 0 %
HCT: 45.3 % (ref 36.0–46.0)
Hemoglobin: 14.5 g/dL (ref 12.0–15.0)
IMMATURE GRANULOCYTES: 1 %
Lymphocytes Relative: 17 %
Lymphs Abs: 1.9 10*3/uL (ref 0.7–4.0)
MCH: 28.7 pg (ref 26.0–34.0)
MCHC: 32 g/dL (ref 30.0–36.0)
MCV: 89.5 fL (ref 80.0–100.0)
Monocytes Absolute: 0.8 10*3/uL (ref 0.1–1.0)
Monocytes Relative: 8 %
NEUTROS ABS: 8.1 10*3/uL — AB (ref 1.7–7.7)
NEUTROS PCT: 74 %
NRBC: 0 % (ref 0.0–0.2)
PLATELETS: 265 10*3/uL (ref 150–400)
RBC: 5.06 MIL/uL (ref 3.87–5.11)
RDW: 14.5 % (ref 11.5–15.5)
WBC: 11 10*3/uL — ABNORMAL HIGH (ref 4.0–10.5)

## 2018-01-07 LAB — URINALYSIS, ROUTINE W REFLEX MICROSCOPIC
BILIRUBIN URINE: NEGATIVE
Glucose, UA: NEGATIVE mg/dL
Ketones, ur: NEGATIVE mg/dL
LEUKOCYTES UA: NEGATIVE
NITRITE: NEGATIVE
PH: 7 (ref 5.0–8.0)
Protein, ur: NEGATIVE mg/dL
SPECIFIC GRAVITY, URINE: 1.013 (ref 1.005–1.030)

## 2018-01-07 LAB — INFLUENZA PANEL BY PCR (TYPE A & B)
INFLAPCR: NEGATIVE
Influenza B By PCR: NEGATIVE

## 2018-01-07 LAB — GROUP A STREP BY PCR: Group A Strep by PCR: NOT DETECTED

## 2018-01-07 MED ORDER — IBUPROFEN 800 MG PO TABS
800.0000 mg | ORAL_TABLET | Freq: Once | ORAL | Status: AC
  Administered 2018-01-07: 800 mg via ORAL
  Filled 2018-01-07: qty 1

## 2018-01-07 MED ORDER — SODIUM CHLORIDE 0.9 % IV BOLUS
1000.0000 mL | Freq: Once | INTRAVENOUS | Status: AC
  Administered 2018-01-07: 1000 mL via INTRAVENOUS

## 2018-01-07 MED ORDER — DOXYCYCLINE HYCLATE 100 MG PO TABS
100.0000 mg | ORAL_TABLET | Freq: Once | ORAL | Status: AC
  Administered 2018-01-07: 100 mg via ORAL
  Filled 2018-01-07: qty 1

## 2018-01-07 MED ORDER — ACETAMINOPHEN 500 MG PO TABS
1000.0000 mg | ORAL_TABLET | Freq: Once | ORAL | Status: AC
  Administered 2018-01-07: 1000 mg via ORAL
  Filled 2018-01-07: qty 2

## 2018-01-07 MED ORDER — DOXYCYCLINE HYCLATE 100 MG PO CAPS
100.0000 mg | ORAL_CAPSULE | Freq: Two times a day (BID) | ORAL | 0 refills | Status: DC
Start: 2018-01-07 — End: 2018-07-16

## 2018-01-07 NOTE — ED Triage Notes (Signed)
Patient complaining of a bumpy rash on her face and fever since yesterday. Patient states she has not ate anything new. Patient states that she think it is the mumps. Patient was vaccinate for the mumps 53 years ago.

## 2018-01-07 NOTE — ED Notes (Signed)
Pt was not aware that a urine sample was needed when pt recently went to the restroom. Pt sts, "its gonna be a minute cause I haven't had anything to drink all day and I just pee'd; I tried to wait but I didn't hear anything for anyone, so I went."

## 2018-01-07 NOTE — Discharge Instructions (Addendum)
Tylenol and ibuprofen for fever.

## 2018-01-07 NOTE — ED Provider Notes (Signed)
[facility] DEPT Provider Note   CSN: [id number] Arrival date & time: 01/07/18  1918     History   Chief Complaint Chief Complaint  Patient presents with  . Rash  . Fever    HPI Gabrielle Walker is a 52 y.o. female.  Pt presents to the ED today with rash to her face and fever.  She has had a cough as well.  She was concerned she had the mumps because she had some swelling to her face.  She has a fever, but has not taken anything for the fever.     Past Medical History:  Diagnosis Date  . Anemia   . Blood transfusion dec 2010  . Fibroid tumor     Patient Active Problem List   Diagnosis Date Noted  . Ovarian cyst, complex 08/01/2010    Past Surgical History:  Procedure Laterality Date  . ABDOMINAL HYSTERECTOMY    . BACK SURGERY    . uterine polyps       OB History    Gravida  5   Para  3   Term  3   Preterm      AB  2   Living  3     SAB  2   TAB      Ectopic      Multiple      Live Births               Home Medications    Prior to Admission medications   Medication Sig Start Date End Date Taking? Authorizing Provider  acetaminophen (TYLENOL) 500 MG tablet Take 1,000 mg by mouth every 6 (six) hours as needed for moderate pain.   Yes [provider]  diphenhydrAMINE (BENADRYL) 25 MG tablet Take 25 mg by mouth every 6 (six) hours as needed for itching or sleep (itching in sleep).   Yes [provider]  diazepam (VALIUM) 5 MG tablet Take 1 tablet (5 mg total) by mouth every 6 (six) hours as needed (spasms). Patient not taking: Reported on 01/07/2018 12/15/13   Debby Freiberg, MD  doxycycline (VIBRAMYCIN) 100 MG capsule Take 1 capsule (100 mg total) by mouth 2 (two) times daily. 01/07/18   Isla Pence, MD    Family History Family History  Problem Relation Age of Onset  . Cancer Mother   . Heart disease Father     Social History Social History   Tobacco Use  . Smoking status:  Current Every Day Smoker    Packs/day: 1.00    Years: 30.00    Pack years: 30.00    Types: Cigarettes  . Smokeless tobacco: Never Used  Substance Use Topics  . Alcohol use: No  . Drug use: No     Allergies   Patient has no known allergies.   Review of Systems Review of Systems  Constitutional: Positive for fever.  HENT:       Bumps on face  Respiratory: Positive for cough.   All other systems reviewed and are negative.    Physical Exam Updated Vital Signs BP (!) 143/90   Pulse 94   Temp (!) 102 F (38.9 C) (Oral)   Resp 16   Ht 5\' 7"  (1.702 m)   Wt 72.6 kg   SpO2 99%   BMI 25.06 kg/m   Physical Exam Vitals signs and nursing note reviewed.  Constitutional:      Appearance: Normal appearance. She is normal weight.  HENT:  Head: Normocephalic and atraumatic.     Comments: Pt does have some redness to her face and nonspecific raised areas.    Right Ear: External ear normal.     Left Ear: External ear normal.     Nose: Nose normal.     Mouth/Throat:     Mouth: Mucous membranes are moist.     Pharynx: Oropharynx is clear.  Eyes:     Extraocular Movements: Extraocular movements intact.     Conjunctiva/sclera: Conjunctivae normal.     Pupils: Pupils are equal, round, and reactive to light.  Neck:     Musculoskeletal: Normal range of motion and neck supple.  Cardiovascular:     Rate and Rhythm: Normal rate and regular rhythm.  Pulmonary:     Effort: Pulmonary effort is normal.     Breath sounds: Normal breath sounds.  Abdominal:     General: Abdomen is flat. Bowel sounds are normal.     Palpations: Abdomen is soft.  Musculoskeletal: Normal range of motion.  Lymphadenopathy:     Cervical: Cervical adenopathy present.  Skin:    General: Skin is warm and dry.     Capillary Refill: Capillary refill takes less than 2 seconds.  Neurological:     General: No focal deficit present.     Mental Status: She is alert and oriented to person, place, and time.    Psychiatric:        Mood and Affect: Mood normal.        Behavior: Behavior normal.        Thought Content: Thought content normal.        Judgment: Judgment normal.      ED Treatments / Results  Labs (all labs ordered are listed, but only abnormal results are displayed) Labs Reviewed  COMPREHENSIVE METABOLIC PANEL - Abnormal; Notable for the following components:      Result Value   Glucose, Bld 114 (*)    ALT 56 (*)    Total Bilirubin 0.2 (*)    All other components within normal limits  CBC WITH DIFFERENTIAL/PLATELET - Abnormal; Notable for the following components:   WBC 11.0 (*)    Neutro Abs 8.1 (*)    Abs Immature Granulocytes 0.08 (*)    All other components within normal limits  URINALYSIS, ROUTINE W REFLEX MICROSCOPIC - Abnormal; Notable for the following components:   Hgb urine dipstick LARGE (*)    Bacteria, UA RARE (*)    All other components within normal limits  GROUP A STREP BY PCR  INFLUENZA PANEL BY PCR (TYPE A & B)    EKG None  Radiology Dg Chest 2 View  Result Date: 01/07/2018 CLINICAL DATA:  Fever EXAM: CHEST - 2 VIEW COMPARISON:  12/15/2013 FINDINGS: Heart size is normal. Mediastinal shadows are normal. There chronic interstitial lung markings. There is bronchial thickening suggesting bronchitis. No consolidation, collapse or effusion. No significant bone finding. IMPRESSION: Chronic interstitial lung markings. Bronchial thickening suggesting bronchitis. No consolidation or collapse. Electronically Signed   By: Nelson Chimes M.D.   On: 01/07/2018 20:26    Procedures Procedures (including critical care time)  Medications Ordered in ED Medications  doxycycline (VIBRA-TABS) tablet 100 mg (has no administration in time range)  sodium chloride 0.9 % bolus 1,000 mL (0 mLs Intravenous Stopped 01/07/18 2229)  acetaminophen (TYLENOL) tablet 1,000 mg (1,000 mg Oral Given 01/07/18 2116)  ibuprofen (ADVIL,MOTRIN) tablet 800 mg (800 mg Oral Given 01/07/18  2116)     Initial Impression /  Assessment and Plan / ED Course  I have reviewed the triage vital signs and the nursing notes.  Pertinent labs & imaging results that were available during my care of the patient were reviewed by me and considered in my medical decision making (see chart for details).    Areas on pt's face which are raised and red are gone with treatment of the fever.  Pt does not look like she has the mumps.  Fever is likely from bronchitis.  She will be started on doxy.    She is told to alternate tylenol/ibuprofen as needed for fever.  Return if worse.   Final Clinical Impressions(s) / ED Diagnoses   Final diagnoses:  Fever in adult  Bronchitis    ED Discharge Orders         Ordered    doxycycline (VIBRAMYCIN) 100 MG capsule  2 times daily     01/07/18 2252           Isla Pence, MD 01/07/18 2258

## 2018-01-07 NOTE — ED Notes (Signed)
Pt instructed to change into gown and given warm blanket.

## 2018-07-16 ENCOUNTER — Other Ambulatory Visit: Payer: Self-pay

## 2018-07-16 ENCOUNTER — Emergency Department (HOSPITAL_COMMUNITY)
Admission: EM | Admit: 2018-07-16 | Discharge: 2018-07-16 | Disposition: A | Discharge status: Home / Self Care | Payer: Self-pay | Attending: Emergency Medicine | Admitting: Emergency Medicine

## 2018-07-16 ENCOUNTER — Encounter (HOSPITAL_COMMUNITY): Payer: Self-pay | Admitting: Emergency Medicine

## 2018-07-16 DIAGNOSIS — M5431 Sciatica, right side: Secondary | ICD-10-CM | POA: Insufficient documentation

## 2018-07-16 DIAGNOSIS — F17210 Nicotine dependence, cigarettes, uncomplicated: Secondary | ICD-10-CM | POA: Insufficient documentation

## 2018-07-16 MED ORDER — KETOROLAC TROMETHAMINE 30 MG/ML IJ SOLN
30.0000 mg | Freq: Once | INTRAMUSCULAR | Status: AC
  Administered 2018-07-16: 30 mg via INTRAVENOUS
  Filled 2018-07-16: qty 1

## 2018-07-16 MED ORDER — HYDROMORPHONE HCL 1 MG/ML IJ SOLN
1.0000 mg | Freq: Once | INTRAMUSCULAR | Status: AC
  Administered 2018-07-16: 09:00:00 1 mg via INTRAVENOUS
  Filled 2018-07-16: qty 1

## 2018-07-16 MED ORDER — PREDNISONE 20 MG PO TABS
60.0000 mg | ORAL_TABLET | Freq: Once | ORAL | Status: AC
  Administered 2018-07-16: 60 mg via ORAL
  Filled 2018-07-16: qty 3

## 2018-07-16 MED ORDER — ACETAMINOPHEN 325 MG PO TABS
650.0000 mg | ORAL_TABLET | Freq: Once | ORAL | Status: AC
  Administered 2018-07-16: 650 mg via ORAL
  Filled 2018-07-16: qty 2

## 2018-07-16 MED ORDER — PREDNISONE 20 MG PO TABS: 40.0000 mg | ORAL_TABLET | Freq: Every day | ORAL | 0 refills | Status: AC

## 2018-07-16 MED ORDER — CYCLOBENZAPRINE HCL 10 MG PO TABS: 10.0000 mg | ORAL_TABLET | Freq: Three times a day (TID) | ORAL | 0 refills | Status: DC | PRN

## 2018-07-16 MED ORDER — OXYCODONE-ACETAMINOPHEN 5-325 MG PO TABS: 1.0000 | ORAL_TABLET | ORAL | 0 refills | Status: DC | PRN

## 2018-07-16 MED ORDER — IBUPROFEN 600 MG PO TABS: 600.0000 mg | ORAL_TABLET | Freq: Three times a day (TID) | ORAL | 0 refills | Status: DC | PRN

## 2018-07-16 NOTE — ED Provider Notes (Signed)
[facility] DEPT Provider Note   CSN: [id number] Arrival date & time: 07/16/18  0865     History   Chief Complaint Chief Complaint  Patient presents with  . Back Pain    HPI Gabrielle Walker is a 53 y.o. female.     HPI Patient is a 53 year old female presents the emergency department with severe right back pain with radiation down her right buttock.  She has a history of recurrent back pain but reports this worsened over the past several days.  Denies fevers and chills.  No weakness of her right lower extremity.  Pain with range of motion.  No trauma.  Denies heavy lifting.  No bowel or bladder complaints.   Past Medical History:  Diagnosis Date  . Anemia   . Blood transfusion dec 2010  . Fibroid tumor     Patient Active Problem List   Diagnosis Date Noted  . Ovarian cyst, complex 08/01/2010    Past Surgical History:  Procedure Laterality Date  . ABDOMINAL HYSTERECTOMY    . BACK SURGERY    . uterine polyps       OB History    Gravida  5   Para  3   Term  3   Preterm      AB  2   Living  3     SAB  2   TAB      Ectopic      Multiple      Live Births               Home Medications    Prior to Admission medications   Medication Sig Start Date End Date Taking? Authorizing Provider  cyclobenzaprine (FLEXERIL) 10 MG tablet Take 1 tablet (10 mg total) by mouth 3 (three) times daily as needed for muscle spasms. 07/16/18   Jola Schmidt, MD  ibuprofen (ADVIL) 600 MG tablet Take 1 tablet (600 mg total) by mouth every 8 (eight) hours as needed. 07/16/18   Jola Schmidt, MD  oxyCODONE-acetaminophen (PERCOCET/ROXICET) 5-325 MG tablet Take 1 tablet by mouth every 4 (four) hours as needed for severe pain. 07/16/18   Jola Schmidt, MD  predniSONE (DELTASONE) 20 MG tablet Take 2 tablets (40 mg total) by mouth daily for 5 days. 07/16/18 07/21/18  Jola Schmidt, MD    Family History Family History  Problem Relation Age of Onset   . Cancer Mother   . Heart disease Father     Social History Social History   Tobacco Use  . Smoking status: Current Every Day Smoker    Packs/day: 1.00    Years: 30.00    Pack years: 30.00    Types: Cigarettes  . Smokeless tobacco: Never Used  Substance Use Topics  . Alcohol use: No  . Drug use: No     Allergies   Patient has no known allergies.   Review of Systems Review of Systems  All other systems reviewed and are negative.    Physical Exam Updated Vital Signs BP (!) 137/101   Pulse 83   Temp 98.4 F (36.9 C) (Oral)   Resp 16   SpO2 98%   Physical Exam Vitals signs and nursing note reviewed.  Constitutional:      General: She is not in acute distress.    Appearance: She is well-developed.  HENT:     Head: Normocephalic and atraumatic.  Neck:     Musculoskeletal: Normal range of motion.  Cardiovascular:  Rate and Rhythm: Normal rate and regular rhythm.  Pulmonary:     Effort: Pulmonary effort is normal.  Abdominal:     General: There is no distension.     Palpations: Abdomen is soft.  Musculoskeletal: Normal range of motion.        General: No tenderness or deformity.     Right lower leg: No edema.     Left lower leg: No edema.     Comments: No thoracic or lumbar point tenderness.  Paralumbar tenderness on the right with tenderness in the right sciatic groove without significant spasm  Skin:    General: Skin is warm and dry.  Neurological:     Mental Status: She is alert and oriented to person, place, and time.  Psychiatric:        Judgment: Judgment normal.      ED Treatments / Results  Labs (all labs ordered are listed, but only abnormal results are displayed) Labs Reviewed - No data to display  EKG    Radiology No results found.  Procedures Procedures (including critical care time)  Medications Ordered in ED Medications  ketorolac (TORADOL) 30 MG/ML injection 30 mg (30 mg Intravenous Given 07/16/18 0839)  HYDROmorphone  (DILAUDID) injection 1 mg (1 mg Intravenous Given 07/16/18 0840)  predniSONE (DELTASONE) tablet 60 mg (60 mg Oral Given 07/16/18 0839)  acetaminophen (TYLENOL) tablet 650 mg (650 mg Oral Given 07/16/18 0839)     Initial Impression / Assessment and Plan / ED Course  I have reviewed the triage vital signs and the nursing notes.  Pertinent labs & imaging results that were available during my care of the patient were reviewed by me and considered in my medical decision making (see chart for details).        Suspect right-sided sciatica.  Improved symptoms here in the emergency department.  No indication for advanced imaging.  Primary care follow-up.  Patient understands return to the ER for new or worsening symptoms.  No weakness of the right lower extremity  Final Clinical Impressions(s) / ED Diagnoses   Final diagnoses:  None    ED Discharge Orders         Ordered    oxyCODONE-acetaminophen (PERCOCET/ROXICET) 5-325 MG tablet  Every 4 hours PRN     07/16/18 0913    predniSONE (DELTASONE) 20 MG tablet  Daily     07/16/18 0913    ibuprofen (ADVIL) 600 MG tablet  Every 8 hours PRN     07/16/18 0913    cyclobenzaprine (FLEXERIL) 10 MG tablet  3 times daily PRN     07/16/18 0913           Jola Schmidt, MD 07/16/18 [id number]

## 2018-07-16 NOTE — ED Triage Notes (Signed)
Pt c/o back pains on right side that radiate down right leg for couple days. Reports is on her feet a lot at work with lots of twisting.

## 2019-07-28 ENCOUNTER — Other Ambulatory Visit: Payer: Self-pay

## 2019-07-28 ENCOUNTER — Emergency Department (HOSPITAL_COMMUNITY): Payer: BLUE CROSS/BLUE SHIELD

## 2019-07-28 ENCOUNTER — Encounter (HOSPITAL_COMMUNITY): Payer: Self-pay | Admitting: Emergency Medicine

## 2019-07-28 ENCOUNTER — Emergency Department (HOSPITAL_COMMUNITY)
Admission: EM | Admit: 2019-07-28 | Discharge: 2019-07-28 | Disposition: A | Discharge status: Home / Self Care | Payer: BLUE CROSS/BLUE SHIELD | Attending: Emergency Medicine | Admitting: Emergency Medicine

## 2019-07-28 DIAGNOSIS — Y939 Activity, unspecified: Secondary | ICD-10-CM | POA: Diagnosis not present

## 2019-07-28 DIAGNOSIS — W108XXA Fall (on) (from) other stairs and steps, initial encounter: Secondary | ICD-10-CM | POA: Diagnosis not present

## 2019-07-28 DIAGNOSIS — S42021A Displaced fracture of shaft of right clavicle, initial encounter for closed fracture: Secondary | ICD-10-CM | POA: Diagnosis not present

## 2019-07-28 DIAGNOSIS — Y92009 Unspecified place in unspecified non-institutional (private) residence as the place of occurrence of the external cause: Secondary | ICD-10-CM | POA: Diagnosis not present

## 2019-07-28 DIAGNOSIS — W19XXXA Unspecified fall, initial encounter: Secondary | ICD-10-CM

## 2019-07-28 DIAGNOSIS — Y999 Unspecified external cause status: Secondary | ICD-10-CM | POA: Insufficient documentation

## 2019-07-28 DIAGNOSIS — M25561 Pain in right knee: Secondary | ICD-10-CM | POA: Diagnosis not present

## 2019-07-28 DIAGNOSIS — S4991XA Unspecified injury of right shoulder and upper arm, initial encounter: Secondary | ICD-10-CM | POA: Diagnosis present

## 2019-07-28 DIAGNOSIS — F17210 Nicotine dependence, cigarettes, uncomplicated: Secondary | ICD-10-CM | POA: Insufficient documentation

## 2019-07-28 MED ORDER — OXYCODONE-ACETAMINOPHEN 5-325 MG PO TABS
1.0000 | ORAL_TABLET | Freq: Once | ORAL | Status: AC
  Administered 2019-07-28: 1 via ORAL
  Filled 2019-07-28: qty 1

## 2019-07-28 MED ORDER — HYDROCODONE-ACETAMINOPHEN 5-325 MG PO TABS: 1.0000 | ORAL_TABLET | Freq: Four times a day (QID) | ORAL | 0 refills | Status: AC | PRN

## 2019-07-28 NOTE — ED Provider Notes (Signed)
[facility] DEPT Provider Note   CSN: [id number] Arrival date & time: 07/28/19  1134     History Chief Complaint  Patient presents with   Fall   Knee Injury   Shoulder Injury   Clavicle Injury    Gabrielle Walker is a 54 y.o. female presenting for evaluation of right shoulder pain after fall.  Patient states just prior to arrival she tripped over her dog on the stairs, fell down 4 stairs, and landed on her right side.  She reports acute onset pain in her right shoulder/collarbone.  She also has some mild pain in her right knee, but is not severe.  She denies numbness in her hands or legs.  She states she hit her head, but was not hard.  She not lose consciousness.  She has no headache or head pain at this time.  No neck or back pain.  She has not ambulated since.  She was given pain medicine with EMS, which improved her pain, has not had anything else.  She denies chest pain, shortness of breath, nausea, vomiting abdominal pain, loss of bowel control.  She is not on blood thinners.  She is about to undergo evaluation (thiw week) with neurology for chronic weakness of her right arm.  HPI     Past Medical History:  Diagnosis Date   Anemia    Blood transfusion dec 2010   Fibroid tumor     Patient Active Problem List   Diagnosis Date Noted   Ovarian cyst, complex 08/01/2010    Past Surgical History:  Procedure Laterality Date   ABDOMINAL HYSTERECTOMY     BACK SURGERY     uterine polyps       OB History    Gravida  5   Para  3   Term  3   Preterm      AB  2   Living  3     SAB  2   TAB      Ectopic      Multiple      Live Births              Family History  Problem Relation Age of Onset   Cancer Mother    Heart disease Father     Social History   Tobacco Use   Smoking status: Current Every Day Smoker    Packs/day: 1.00    Years: 30.00    Pack years: 30.00    Types: Cigarettes   Smokeless  tobacco: Never Used  Vaping Use   Vaping Use: Never used  Substance Use Topics   Alcohol use: No   Drug use: No    Home Medications Prior to Admission medications   Medication Sig Start Date End Date Taking? Authorizing Provider  acetaminophen (TYLENOL) 500 MG tablet Take 1,000 mg by mouth every 6 (six) hours as needed for moderate pain.   Yes [provider]  celecoxib (CELEBREX) 100 MG capsule Take 100 mg by mouth 2 (two) times daily. 06/10/19  Yes [provider]  Clobetasol Propionate 0.05 % shampoo Apply 1 application topically daily.  06/26/19  Yes [provider]  diphenhydrAMINE (BENADRYL) 25 MG tablet Take 25 mg by mouth every 6 (six) hours as needed for itching or sleep.   Yes [provider]  gabapentin (NEURONTIN) 300 MG capsule Take 300 mg by mouth 3 (three) times daily. 07/27/19  Yes [provider]  cyclobenzaprine (FLEXERIL) 10 MG tablet Take  1 tablet (10 mg total) by mouth 3 (three) times daily as needed for muscle spasms. Patient not taking: Reported on 07/28/2019 07/16/18   Jola Schmidt, MD  HYDROcodone-acetaminophen (NORCO/VICODIN) 5-325 MG tablet Take 1 tablet by mouth 2 (two) times daily as needed. Patient not taking: Reported on 07/28/2019 06/10/19   [provider]  HYDROcodone-acetaminophen (NORCO/VICODIN) 5-325 MG tablet Take 1 tablet by mouth every 6 (six) hours as needed for severe pain. 07/28/19   Bentleigh Stankus, PA-C  ibuprofen (ADVIL) 600 MG tablet Take 1 tablet (600 mg total) by mouth every 8 (eight) hours as needed. Patient not taking: Reported on 07/28/2019 07/16/18   Jola Schmidt, MD  meloxicam (MOBIC) 15 MG tablet Take 15 mg by mouth daily. Patient not taking: Reported on 07/28/2019 03/29/19   [provider]  oxyCODONE-acetaminophen (PERCOCET/ROXICET) 5-325 MG tablet Take 1 tablet by mouth every 4 (four) hours as needed for severe pain. Patient not taking: Reported on 07/28/2019 07/16/18   Jola Schmidt, MD  triamcinolone lotion (KENALOG) 0.1 % Apply topically daily. Patient not taking: Reported on 07/28/2019 05/06/19   [provider]  triamcinolone ointment (KENALOG) 0.1 % Apply topically 2 (two) times daily. Patient not taking: Reported on 07/28/2019 06/26/19   [provider]    Allergies    Patient has no known allergies.  Review of Systems   Review of Systems  Musculoskeletal: Positive for arthralgias.  All other systems reviewed and are negative.   Physical Exam Updated Vital Signs BP 102/70 (BP Location: Left Arm)    Pulse 70    Temp 97.9 F (36.6 C) (Oral)    Resp 17    Ht 5\' 7"  (1.702 m)    Wt 72.6 kg    SpO2 96%    BMI 25.07 kg/m   Physical Exam Vitals and nursing note reviewed.  Constitutional:      General: She is not in acute distress.    Appearance: She is well-developed.     Comments: Sitting in the bed in no acute distress  HENT:     Head: Normocephalic and atraumatic.  Eyes:     Conjunctiva/sclera: Conjunctivae normal.     Pupils: Pupils are equal, round, and reactive to light.  Neck:     Comments: No tenderness palpation midline C-spine.  No step-offs or deformities.  Moving head in all directions without pain Cardiovascular:     Rate and Rhythm: Normal rate and regular rhythm.     Pulses: Normal pulses.  Pulmonary:     Effort: Pulmonary effort is normal. No respiratory distress.     Breath sounds: Normal breath sounds. No wheezing.     Comments: No tenderness to palpation the chest wall.  Clear lung sounds in all fields.  Speaking in full sentences. Chest:     Chest wall: No tenderness.  Abdominal:     General: There is no distension.     Palpations: Abdomen is soft. There is no mass.     Tenderness: There is no abdominal tenderness. There is no guarding or rebound.  Musculoskeletal:        General: Tenderness present.     Cervical back: Normal range of motion and neck supple.     Comments: Tenderness palpation of the right  shoulder over the clavicle.  No tenderness palpation over the humerus.  No obvious deformity.  No tenderness palpation of the elbow, forearm, or wrist.  Radial pulses 2+ bilaterally.  No tenderness palpation of back or midline spine.  Elvis is stable and intact.  No tenderness palpation of the left lower extremity.  Superficial abrasions over the anterior right knee without swelling or tenderness.  Full active range of motion of the knee without difficulty.  Skin:    General: Skin is warm and dry.     Capillary Refill: Capillary refill takes less than 2 seconds.  Neurological:     Mental Status: She is alert and oriented to person, place, and time.     ED Results / Procedures / Treatments   Labs (all labs ordered are listed, but only abnormal results are displayed) Labs Reviewed - No data to display  EKG None  Radiology DG Clavicle Right  Result Date: 07/28/2019 CLINICAL DATA:  [place].  Right shoulder pain. EXAM: RIGHT CLAVICLE - 2+ VIEWS COMPARISON:  Shoulder radiographs, same date. FINDINGS: Comminuted and mildly displaced fracture of the distal clavicle. The [facility] joint is intact. IMPRESSION: Comminuted and mildly displaced distal clavicle fracture. Electronically Signed   By: Marijo Sanes M.D.   On: 07/28/2019 12:59   DG Shoulder Right  Result Date: 07/28/2019 CLINICAL DATA:  [place] today and fell down stairs.  Tripped over dog. EXAM: RIGHT SHOULDER - 2+ VIEW COMPARISON:  None. FINDINGS: Mildly displaced and comminuted fracture of the distal clavicle. Probable avulsion type component noted near the coracoid process likely related to the coracoclavicular ligament. The [facility] joint is intact and the glenohumeral joint is intact. No humeral head or neck fracture. The visualized right ribs are intact. IMPRESSION: Mildly displaced and comminuted fracture of the distal clavicle. Electronically Signed   By: Marijo Sanes M.D.   On: 07/28/2019 12:58   DG Knee Complete 4 Views Right  Result Date:  07/28/2019 CLINICAL DATA:  [place].  Right knee pain. EXAM: RIGHT KNEE - COMPLETE 4+ VIEW COMPARISON:  None. FINDINGS: The joint spaces are maintained. No acute fracture is identified. No osteochondral abnormality. No joint effusion. IMPRESSION: No acute bony findings or joint effusion. Electronically Signed   By: Marijo Sanes M.D.   On: 07/28/2019 12:59    Procedures Procedures (including critical care time)  Medications Ordered in ED Medications  oxyCODONE-acetaminophen (PERCOCET/ROXICET) 5-325 MG per tablet 1 tablet (1 tablet Oral Given 07/28/19 1302)    ED Course  I have reviewed the triage vital signs and the nursing notes.  Pertinent labs & imaging results that were available during my care of the patient were reviewed by me and considered in my medical decision making (see chart for details).    MDM Rules/Calculators/A&P                          Patient resenting for evaluation of right shoulder and right knee pain after a fall.  On exam, patient appears nontoxic.  She has no headache or neck pain.  Did not lose consciousness, states when she had her head it was not very hard.  Discussed option of CT imaging, patient declines.  I am agreeable with this.  Will obtain x-rays of the shoulder, clavicle, knee, these other locations of pain.  X-rays viewed interpreted by me, shows a clavicle fracture.  No fracture of the shoulder or knee.  Discussed findings with patient.  Discussed treatment with sling and pain control.  She does not have tenting or neurovascular deficits.  Encourage follow-up with Ortho.  Patient ambulated in the ED without difficulty prior to discharge.  At this time, patient appears safe for discharge.  Return precautions given.  Patient states she understands and agrees to plan.  Final Clinical Impression(s) / ED Diagnoses Final diagnoses:  Closed displaced fracture of shaft of right clavicle, initial encounter  Acute pain of right knee  Fall, initial encounter     Rx / DC Orders ED Discharge Orders         Ordered    HYDROcodone-acetaminophen (NORCO/VICODIN) 5-325 MG tablet  Every 6 hours PRN     Discontinue  Reprint     07/28/19 [place], Nekeisha Aure, PA-C 07/28/19 1435    Virgel Manifold, MD 07/29/19 1256

## 2019-07-28 NOTE — Discharge Instructions (Signed)
Take ibuprofen 3 times a day with meals.  Do not take other anti-inflammatories at the same time (Advil, Motrin, naproxen, Aleve). You may supplement with Tylenol if you need further pain control. Use Norco as needed for severe breakthrough pain.  Have caution, this make you tired or groggy.  Do not drive or operate heavy machinery while taking this medicine. Use ice packs for pain control.  Follow-up with an orthopedic doctor.  You may follow-up with Dr. Alvan Dame, who is on call, but if you prefer to follow-up with [facility], you can call them and set up a follow-up appointment. Return to the emergency room if you develop severe worsening pain, color change of your hand, difficulty breathing, new numbness of your hand, any new, worsening, concerning symptoms.

## 2019-07-28 NOTE — ED Triage Notes (Signed)
Patient here from home vi EMS reporting fall today after tripping over dog. Reports fall down 4 cement stairs afterwards. States that she did hit head but no LOC. Reports right knee pain, and right shoulder/clavical pain. 100 Fent given.

## 2019-07-28 NOTE — Progress Notes (Signed)
Orthopedic Tech Progress Note Patient Details:  Gabrielle Walker Oct 11, 1965 [id number]  Ortho Devices Type of Ortho Device: Sling immobilizer Ortho Device/Splint Location: right Ortho Device/Splint Interventions: Application   Post Interventions Patient Tolerated: Well Instructions Provided: Care of device   [place] Pink 07/28/2019, 1:07 PM

## 2020-12-17 ENCOUNTER — Other Ambulatory Visit: Payer: Self-pay

## 2020-12-17 ENCOUNTER — Observation Stay (HOSPITAL_COMMUNITY)
Admission: EM | Admit: 2020-12-17 | Discharge: 2020-12-19 | Disposition: A | Discharge status: Home / Self Care | Payer: BLUE CROSS/BLUE SHIELD | Attending: Internal Medicine | Admitting: Internal Medicine

## 2020-12-17 ENCOUNTER — Emergency Department (HOSPITAL_COMMUNITY): Payer: BLUE CROSS/BLUE SHIELD

## 2020-12-17 ENCOUNTER — Encounter (HOSPITAL_COMMUNITY): Payer: Self-pay

## 2020-12-17 DIAGNOSIS — J9601 Acute respiratory failure with hypoxia: Secondary | ICD-10-CM | POA: Diagnosis not present

## 2020-12-17 DIAGNOSIS — E871 Hypo-osmolality and hyponatremia: Secondary | ICD-10-CM | POA: Diagnosis not present

## 2020-12-17 DIAGNOSIS — D751 Secondary polycythemia: Secondary | ICD-10-CM | POA: Diagnosis present

## 2020-12-17 DIAGNOSIS — R739 Hyperglycemia, unspecified: Secondary | ICD-10-CM | POA: Diagnosis not present

## 2020-12-17 DIAGNOSIS — J101 Influenza due to other identified influenza virus with other respiratory manifestations: Secondary | ICD-10-CM | POA: Diagnosis not present

## 2020-12-17 DIAGNOSIS — Z720 Tobacco use: Secondary | ICD-10-CM | POA: Diagnosis present

## 2020-12-17 DIAGNOSIS — Z20822 Contact with and (suspected) exposure to covid-19: Secondary | ICD-10-CM | POA: Insufficient documentation

## 2020-12-17 DIAGNOSIS — R0602 Shortness of breath: Secondary | ICD-10-CM | POA: Diagnosis present

## 2020-12-17 DIAGNOSIS — F17210 Nicotine dependence, cigarettes, uncomplicated: Secondary | ICD-10-CM | POA: Insufficient documentation

## 2020-12-17 LAB — BASIC METABOLIC PANEL
Anion gap: 11 (ref 5–15)
BUN: 12 mg/dL (ref 6–20)
CO2: 22 mmol/L (ref 22–32)
Calcium: 8.7 mg/dL — ABNORMAL LOW (ref 8.9–10.3)
Chloride: 98 mmol/L (ref 98–111)
Creatinine, Ser: 0.71 mg/dL (ref 0.44–1.00)
GFR, Estimated: >60 mL/min (ref >60)
Glucose, Bld: 191 mg/dL — ABNORMAL HIGH (ref 70–99)
Potassium: 3.6 mmol/L (ref 3.5–5.1)
Sodium: 131 mmol/L — ABNORMAL LOW (ref 135–145)

## 2020-12-17 LAB — CBC WITH DIFFERENTIAL/PLATELET
Abs Immature Granulocytes: 0.06 10*3/uL (ref 0.00–0.07)
Basophils Absolute: 0 10*3/uL (ref 0.0–0.1)
Basophils Relative: 0 %
Eosinophils Absolute: 0 10*3/uL (ref 0.0–0.5)
Eosinophils Relative: 0 %
HCT: 46.8 % — ABNORMAL HIGH (ref 36.0–46.0)
Hemoglobin: 15.3 g/dL — ABNORMAL HIGH (ref 12.0–15.0)
Immature Granulocytes: 1 %
Lymphocytes Relative: 15 %
Lymphs Abs: 1.4 10*3/uL (ref 0.7–4.0)
MCH: 28.3 pg (ref 26.0–34.0)
MCHC: 32.7 g/dL (ref 30.0–36.0)
MCV: 86.5 fL (ref 80.0–100.0)
Monocytes Absolute: 0.7 10*3/uL (ref 0.1–1.0)
Monocytes Relative: 7 %
Neutro Abs: 7.6 10*3/uL (ref 1.7–7.7)
Neutrophils Relative %: 77 %
Platelets: 270 10*3/uL (ref 150–400)
RBC: 5.41 MIL/uL — ABNORMAL HIGH (ref 3.87–5.11)
RDW: 14.5 % (ref 11.5–15.5)
WBC: 9.7 10*3/uL (ref 4.0–10.5)
nRBC: 0 % (ref 0.0–0.2)

## 2020-12-17 LAB — RESP PANEL BY RT-PCR (FLU A&B, COVID) ARPGX2
Influenza A by PCR: POSITIVE — AB
Influenza B by PCR: NEGATIVE
SARS Coronavirus 2 by RT PCR: NEGATIVE

## 2020-12-17 MED ORDER — IPRATROPIUM-ALBUTEROL 0.5-2.5 (3) MG/3ML IN SOLN
3.0000 mL | RESPIRATORY_TRACT | Status: DC
  Administered 2020-12-17 (×2): 3 mL via RESPIRATORY_TRACT
  Filled 2020-12-17 (×2): qty 3

## 2020-12-17 MED ORDER — IPRATROPIUM BROMIDE 0.02 % IN SOLN: 0.5000 mg | Freq: Four times a day (QID) | RESPIRATORY_TRACT | Status: DC

## 2020-12-17 MED ORDER — GUAIFENESIN-DM 100-10 MG/5ML PO SYRP
10.0000 mL | ORAL_SOLUTION | ORAL | Status: DC | PRN
  Administered 2020-12-17: 10 mL via ORAL
  Filled 2020-12-17: qty 10

## 2020-12-17 MED ORDER — ALBUTEROL SULFATE (2.5 MG/3ML) 0.083% IN NEBU
5.0000 mg | INHALATION_SOLUTION | Freq: Once | RESPIRATORY_TRACT | Status: AC
  Administered 2020-12-17: 5 mg via RESPIRATORY_TRACT
  Filled 2020-12-17: qty 6

## 2020-12-17 MED ORDER — IPRATROPIUM BROMIDE 0.02 % IN SOLN
0.5000 mg | Freq: Once | RESPIRATORY_TRACT | Status: AC
  Administered 2020-12-17: 0.5 mg via RESPIRATORY_TRACT
  Filled 2020-12-17: qty 2.5

## 2020-12-17 MED ORDER — OSELTAMIVIR PHOSPHATE 75 MG PO CAPS
75.0000 mg | ORAL_CAPSULE | Freq: Two times a day (BID) | ORAL | Status: DC
  Administered 2020-12-17 – 2020-12-19 (×5): 75 mg via ORAL
  Filled 2020-12-17 (×5): qty 1

## 2020-12-17 MED ORDER — LACTATED RINGERS IV SOLN: INTRAVENOUS | Status: AC

## 2020-12-17 MED ORDER — HYDROCODONE-ACETAMINOPHEN 5-325 MG PO TABS
1.0000 | ORAL_TABLET | Freq: Three times a day (TID) | ORAL | Status: DC | PRN
  Administered 2020-12-17: 1 via ORAL
  Filled 2020-12-17: qty 1

## 2020-12-17 MED ORDER — ONDANSETRON HCL 4 MG PO TABS
4.0000 mg | ORAL_TABLET | Freq: Four times a day (QID) | ORAL | Status: DC | PRN
  Administered 2020-12-19: 4 mg via ORAL
  Filled 2020-12-17: qty 1

## 2020-12-17 MED ORDER — ONDANSETRON HCL 4 MG/2ML IJ SOLN
4.0000 mg | Freq: Four times a day (QID) | INTRAMUSCULAR | Status: DC | PRN
  Administered 2020-12-17: 4 mg via INTRAVENOUS
  Filled 2020-12-17: qty 2

## 2020-12-17 MED ORDER — IPRATROPIUM-ALBUTEROL 0.5-2.5 (3) MG/3ML IN SOLN
3.0000 mL | Freq: Three times a day (TID) | RESPIRATORY_TRACT | Status: DC
Start: 2020-12-17 — End: 2020-12-19
  Administered 2020-12-17 – 2020-12-19 (×5): 3 mL via RESPIRATORY_TRACT
  Filled 2020-12-17 (×6): qty 3

## 2020-12-17 MED ORDER — POTASSIUM CHLORIDE CRYS ER 20 MEQ PO TBCR
20.0000 meq | EXTENDED_RELEASE_TABLET | Freq: Once | ORAL | Status: AC
  Administered 2020-12-17: 20 meq via ORAL
  Filled 2020-12-17: qty 1

## 2020-12-17 MED ORDER — ENOXAPARIN SODIUM 40 MG/0.4ML IJ SOSY
40.0000 mg | PREFILLED_SYRINGE | INTRAMUSCULAR | Status: DC
  Administered 2020-12-17 – 2020-12-18 (×2): 40 mg via SUBCUTANEOUS
  Filled 2020-12-17 (×2): qty 0.4

## 2020-12-17 MED ORDER — MAGNESIUM SULFATE 2 GM/50ML IV SOLN
2.0000 g | Freq: Once | INTRAVENOUS | Status: AC
Start: 2020-12-17 — End: 2020-12-17
  Administered 2020-12-17: 2 g via INTRAVENOUS
  Filled 2020-12-17: qty 50

## 2020-12-17 MED ORDER — ACETAMINOPHEN 325 MG PO TABS
650.0000 mg | ORAL_TABLET | Freq: Four times a day (QID) | ORAL | Status: DC | PRN
  Administered 2020-12-17: 650 mg via ORAL
  Filled 2020-12-17 (×2): qty 2

## 2020-12-17 MED ORDER — ACETAMINOPHEN 500 MG PO TABS
1000.0000 mg | ORAL_TABLET | Freq: Once | ORAL | Status: AC
  Administered 2020-12-17: 1000 mg via ORAL
  Filled 2020-12-17: qty 2

## 2020-12-17 MED ORDER — ALBUTEROL SULFATE (2.5 MG/3ML) 0.083% IN NEBU: 2.5000 mg | INHALATION_SOLUTION | Freq: Four times a day (QID) | RESPIRATORY_TRACT | Status: DC

## 2020-12-17 MED ORDER — ALBUTEROL SULFATE (2.5 MG/3ML) 0.083% IN NEBU: 2.5000 mg | INHALATION_SOLUTION | RESPIRATORY_TRACT | Status: DC | PRN

## 2020-12-17 MED ORDER — DULOXETINE HCL 60 MG PO CPEP
60.0000 mg | ORAL_CAPSULE | Freq: Every day | ORAL | Status: DC
  Administered 2020-12-17 – 2020-12-18 (×2): 60 mg via ORAL
  Filled 2020-12-17 (×2): qty 1

## 2020-12-17 MED ORDER — VITAMIN B-12 1000 MCG PO TABS
1000.0000 ug | ORAL_TABLET | Freq: Every day | ORAL | Status: DC
  Administered 2020-12-18 – 2020-12-19 (×2): 1000 ug via ORAL
  Filled 2020-12-17 (×2): qty 1

## 2020-12-17 MED ORDER — LEVOTHYROXINE SODIUM 50 MCG PO TABS
50.0000 ug | ORAL_TABLET | Freq: Every day | ORAL | Status: DC
  Administered 2020-12-18 – 2020-12-19 (×2): 50 ug via ORAL
  Filled 2020-12-17 (×2): qty 1

## 2020-12-17 MED ORDER — GABAPENTIN 300 MG PO CAPS
600.0000 mg | ORAL_CAPSULE | Freq: Three times a day (TID) | ORAL | Status: DC
  Administered 2020-12-17 – 2020-12-19 (×6): 600 mg via ORAL
  Filled 2020-12-17 (×6): qty 2

## 2020-12-17 MED ORDER — ACETAMINOPHEN 650 MG RE SUPP: 650.0000 mg | Freq: Four times a day (QID) | RECTAL | Status: DC | PRN

## 2020-12-17 MED ORDER — ROSUVASTATIN CALCIUM 20 MG PO TABS
20.0000 mg | ORAL_TABLET | Freq: Every day | ORAL | Status: DC
  Administered 2020-12-17 – 2020-12-19 (×3): 20 mg via ORAL
  Filled 2020-12-17 (×3): qty 1

## 2020-12-17 NOTE — ED Triage Notes (Addendum)
Pt arrives EMS from home with c/o "flu-like sx" beginning  yesterday. Says flu is running through the house. This morning pt woke up gasping. Per EMS wheezing in all fields. 5mg  albuterol administered enroute. Initial o2 on scene was 86% ra. Temp 102 at home and took 1000mg  tylenol around 0350.    5mg  albuterol ned 125mg  solumedrol   2g Lfa inserted by EMS

## 2020-12-17 NOTE — ED Provider Notes (Signed)
[facility] DEPT Provider Note   CSN: [id number] Arrival date & time: 12/17/20  0636     History Chief Complaint  Patient presents with   Shortness of Breath    Gabrielle Walker is a 55 y.o. female.  The history is provided by the patient.  Shortness of Breath Severity:  Severe Onset quality:  Gradual Timing:  Constant Progression since onset: slighly improved after nebs by EMS. Chronicity:  New Context: URI   Context comment:  Everybody in her family has the flu.  She started feeling sick on Tuesday. Relieved by: Better with sitting up and much better after the nebulizer. Exacerbated by: Lying down, coughing, activity. Ineffective treatments: OTC medication. Associated symptoms: chest pain, cough, fever, headaches and wheezing   Associated symptoms: no abdominal pain, no sore throat, no sputum production and no vomiting   Associated symptoms comment:  Fever up to 102 that does get better with OTC meds.  She does not use any inhalers at home but does smoke 1 pack a day. Risk factors: tobacco use       Past Medical History:  Diagnosis Date   Anemia    Blood transfusion dec 2010   Fibroid tumor     Patient Active Problem List   Diagnosis Date Noted   Ovarian cyst, complex 08/01/2010    Past Surgical History:  Procedure Laterality Date   ABDOMINAL HYSTERECTOMY     BACK SURGERY     uterine polyps       OB History     Gravida  5   Para  3   Term  3   Preterm      AB  2   Living  3      SAB  2   IAB      Ectopic      Multiple      Live Births              Family History  Problem Relation Age of Onset   Cancer Mother    Heart disease Father     Social History   Tobacco Use   Smoking status: Every Day    Packs/day: 1.00    Years: 30.00    Pack years: 30.00    Types: Cigarettes   Smokeless tobacco: Never  Vaping Use   Vaping Use: Never used  Substance Use Topics   Alcohol use: No   Drug use: No     Home Medications Prior to Admission medications   Medication Sig Start Date End Date Taking? Authorizing Provider  acetaminophen (TYLENOL) 500 MG tablet Take 1,000 mg by mouth every 6 (six) hours as needed for moderate pain.    [provider]  celecoxib (CELEBREX) 100 MG capsule Take 100 mg by mouth 2 (two) times daily. 06/10/19   [provider]  Clobetasol Propionate 0.05 % shampoo Apply 1 application topically daily.  06/26/19   [provider]  cyclobenzaprine (FLEXERIL) 10 MG tablet Take 1 tablet (10 mg total) by mouth 3 (three) times daily as needed for muscle spasms. Patient not taking: Reported on 07/28/2019 07/16/18   Jola Schmidt, MD  diphenhydrAMINE (BENADRYL) 25 MG tablet Take 25 mg by mouth every 6 (six) hours as needed for itching or sleep.    [provider]  gabapentin (NEURONTIN) 300 MG capsule Take 300 mg by mouth 3 (three) times daily. 07/27/19   [provider]  HYDROcodone-acetaminophen (NORCO/VICODIN) 5-325 MG tablet Take 1 tablet  by mouth 2 (two) times daily as needed. Patient not taking: Reported on 07/28/2019 06/10/19   [provider]  HYDROcodone-acetaminophen (NORCO/VICODIN) 5-325 MG tablet Take 1 tablet by mouth every 6 (six) hours as needed for severe pain. 07/28/19   [place], Sophia, PA-C  ibuprofen (ADVIL) 600 MG tablet Take 1 tablet (600 mg total) by mouth every 8 (eight) hours as needed. Patient not taking: Reported on 07/28/2019 07/16/18   Jola Schmidt, MD  meloxicam (MOBIC) 15 MG tablet Take 15 mg by mouth daily. Patient not taking: Reported on 07/28/2019 03/29/19   [provider]  oxyCODONE-acetaminophen (PERCOCET/ROXICET) 5-325 MG tablet Take 1 tablet by mouth every 4 (four) hours as needed for severe pain. Patient not taking: Reported on 07/28/2019 07/16/18   Jola Schmidt, MD  triamcinolone lotion (KENALOG) 0.1 % Apply topically daily. Patient not taking: Reported on 07/28/2019 05/06/19   [provider]  triamcinolone ointment (KENALOG) 0.1 % Apply topically 2 (two) times daily. Patient not taking: Reported on 07/28/2019 06/26/19   [provider]    Allergies    Fentanyl  Review of Systems   Review of Systems  Constitutional:  Positive for fever.  HENT:  Negative for sore throat.   Respiratory:  Positive for cough, shortness of breath and wheezing. Negative for sputum production.   Cardiovascular:  Positive for chest pain.  Gastrointestinal:  Negative for abdominal pain and vomiting.  Neurological:  Positive for headaches.  All other systems reviewed and are negative.  Physical Exam Updated Vital Signs BP (!) 129/103   Pulse (!) 110   Temp 98.2 F (36.8 C) (Oral)   Resp (!) 24   Ht 5\' 7"  (1.702 m)   Wt 72.6 kg   SpO2 (!) 89%   BMI 25.06 kg/m   Physical Exam Vitals and nursing note reviewed.  Constitutional:      General: She is not in acute distress.    Appearance: She is well-developed.  HENT:     Head: Normocephalic and atraumatic.     Mouth/Throat:     Mouth: Mucous membranes are moist.  Eyes:     Pupils: Pupils are equal, round, and reactive to light.  Cardiovascular:     Rate and Rhythm: Regular rhythm. Tachycardia present.     Heart sounds: Normal heart sounds. No murmur heard.   No friction rub.  Pulmonary:     Effort: Pulmonary effort is normal. Tachypnea present.     Breath sounds: Wheezing present. No rales.  Abdominal:     General: Bowel sounds are normal. There is no distension.     Palpations: Abdomen is soft.     Tenderness: There is no abdominal tenderness. There is no guarding or rebound.  Musculoskeletal:        General: No tenderness. Normal range of motion.     Cervical back: Normal range of motion and neck supple.     Right lower leg: No edema.     Left lower leg: No edema.     Comments: No edema  Skin:    General: Skin is warm and dry.     Findings: No rash.  Neurological:     Mental Status: She is alert and  oriented to person, place, and time. Mental status is at baseline.     Cranial Nerves: No cranial nerve deficit.  Psychiatric:        Mood and Affect: Mood normal.        Behavior: Behavior normal.  ED Results / Procedures / Treatments   Labs (all labs ordered are listed, but only abnormal results are displayed) Labs Reviewed  RESP PANEL BY RT-PCR (FLU A&B, COVID) ARPGX2 - Abnormal; Notable for the following components:      Result Value   Influenza A by PCR POSITIVE (*)    All other components within normal limits  CBC WITH DIFFERENTIAL/PLATELET - Abnormal; Notable for the following components:   RBC 5.41 (*)    Hemoglobin 15.3 (*)    HCT 46.8 (*)    All other components within normal limits  BASIC METABOLIC PANEL - Abnormal; Notable for the following components:   Sodium 131 (*)    Glucose, Bld 191 (*)    Calcium 8.7 (*)    All other components within normal limits    EKG EKG Interpretation  Date/Time:  Thursday December 17 2020 06:54:03 EST Ventricular Rate:  110 PR Interval:  127 QRS Duration: 86 QT Interval:  339 QTC Calculation: 459 R Axis:   73 Text Interpretation: Sinus tachycardia Prominent P waves, nondiagnostic Confirmed by Blanchie Dessert ([id number]) on 12/17/2020 7:54:47 AM  Radiology DG Chest 2 View  Result Date: 12/17/2020 CLINICAL DATA:  Shortness of breath.  Coughing congestion. EXAM: CHEST - 2 VIEW COMPARISON:  01/07/2018 FINDINGS: The lungs are clear without focal pneumonia, edema, pneumothorax or pleural effusion. Subtle nodular density seen in the right apex, between the anterior first and second ribs. Interstitial markings are diffusely coarsened with chronic features. The cardiopericardial silhouette is within normal limits for size. The visualized bony structures of the thorax show no acute abnormality. IMPRESSION: Subtle nodular density in the right lung apex. Dedicated CT chest without contrast recommended to further evaluate. Chronic interstitial  coarsening. No active cardiopulmonary disease. Electronically Signed   By: Misty Stanley M.D.   On: 12/17/2020 07:30   CT Chest Wo Contrast  Result Date: 12/17/2020 CLINICAL DATA:  Flu-like symptoms, nodule seen on chest x-ray EXAM: CT CHEST WITHOUT CONTRAST TECHNIQUE: Multidetector CT imaging of the chest was performed following the standard protocol without IV contrast. COMPARISON:  Same day chest radiograph FINDINGS: Cardiovascular: The heart is not enlarged. There is no pericardial effusion. Mild coronary artery calcifications are seen. The thoracic aorta is unremarkable. Mediastinum/Nodes: There are small calcifications in the left thyroid lobe. The esophagus is grossly unremarkable. There is no mediastinal or axillary lymphadenopathy. There is no bulky hilar adenopathy Lungs/Pleura: The trachea and central airways are patent. There are a few areas of mucoid impaction in bilateral lower lobe airways (for example 7-96). The lungs are well inflated. There is no focal consolidation or pulmonary edema. There is no pleural effusion or pneumothorax. There is a small calcified granuloma in the left lower lobe. There are no suspicious nodules. The finding on the prior chest radiograph likely reflected artifact related to overlying tissues. Upper Abdomen: There is an incompletely imaged right adrenal nodule measuring -4 Hounsfield units consistent with an adenoma. The imaged portions of the upper abdominal viscera are otherwise unremarkable. Musculoskeletal: There is no acute osseous abnormality or aggressive osseous lesion. IMPRESSION: 1. No suspicious pulmonary nodules. The finding on the chest radiograph was likely artifactual related to overlapping tissues. 2. Scattered areas of mucoid impaction in distal airways bilaterally. Otherwise, no evidence of acute cardiopulmonary process. 3. Incompletely imaged right adrenal adenoma. Electronically Signed   By: Valetta Mole M.D.   On: 12/17/2020 09:27     Procedures Procedures   Medications Ordered in ED Medications  albuterol (PROVENTIL) (2.5  MG/3ML) 0.083% nebulizer solution 5 mg (5 mg Nebulization Given 12/17/20 0727)  ipratropium (ATROVENT) nebulizer solution 0.5 mg (0.5 mg Nebulization Given 12/17/20 5681)    ED Course  I have reviewed the triage vital signs and the nursing notes.  Pertinent labs & imaging results that were available during my care of the patient were reviewed by me and considered in my medical decision making (see chart for details).    MDM Rules/Calculators/A&P                           Pt with symptoms consistent with influenza.  Febrile and hypoxic pta.  EMS reported O2 sat of 95% on room air.  She did receive albuterol and Atrovent in route with some improvement but still is complaining of shortness of breath.  Sats between 89 and 92% on room air currently.  Concern for flu with reactive airways as patient does smoke 1 pack/day.  She does not have a history of COPD and does not use inhalers regularly.  No signs of strep pharyngitis, otitis or abnormal abdominal findings.   CXR shows a subtle nodular density in the right lung apex which they read commended dedicated CT without contrast but otherwise coarse interstitial markings that are chronic and no other acute findings.  CBC and CMP without acute findings today.  EKG with sinus tachycardia but no other findings.  Patient is flu positive today.  We will continue albuterol Atrovent.  Will reassess for admission or discharge.  9:01 AM Patient is still having shortness of breath.  She will be given a second albuterol, Atrovent treatment and she now appears to be febrile.  She was given 1 g of Tylenol.  Currently on 2 L with oxygen sats of 95% but still mildly tachypneic.  10:15 AM On reevaluation patient reports breathing is somewhat better but still requiring 2 L of oxygen and still mildly tachypneic.  CT without acute findings.  Will admit for flu and acute  respiratory failure with ongoing hypoxia.   MDM   Amount and/or Complexity of Data Reviewed Clinical lab tests: ordered and reviewed Tests in the radiology section of CPT: ordered and reviewed Tests in the medicine section of CPT: ordered and reviewed Independent visualization of images, tracings, or specimens: yes    Final Clinical Impression(s) / ED Diagnoses Final diagnoses:  Influenza A  Acute respiratory failure with hypoxia [facility])    Rx / DC Orders ED Discharge Orders     None        Blanchie Dessert, MD 12/17/20 1017

## 2020-12-17 NOTE — ED Notes (Signed)
Pt received biscuit and OJ.

## 2020-12-17 NOTE — H&P (Signed)
History and Physical    Gabrielle Walker ZWC:[id number] DOB: January 23, 1965 DOA: 12/17/2020  PCP: [facility]  Patient coming from: Home.   I have personally briefly reviewed patient's old medical records in [place]  Chief Complaint: Shortness of breath.  HPI: Gabrielle Walker is a 55 y.o. female with medical history significant of chronic blood loss anemia, fibroid tumor, history of blood transfusion, tobacco use who is coming to the emergency department due to progressively worse flulike symptoms started yesterday in the form of headache, rhinorrhea, sore throat, dry cough with occasional sputum production, wheezing and shortness of breath.  The patient stated that while she was sleeping she woke up febrile, on shift, dyspneic and had to call EMS.  Initial O2 sat was 86% and temperature 102 F.  She took about 7 mg of acetaminophen.  EMS gave her a 5 mg albuterol neb and 125 mg of Solu-Medrol.  She stated she has being exposed to somebody with influenza.  She denied chest pain, palpitations, diaphoresis, PND, orthopnea or pitting edema of the lower extremities.  No abdominal pain, nausea, emesis, diarrhea, constipation, melena or hematochezia.  No dysuria, flank pain, frequency or hematuria.  No polyuria, polydipsia, polyphagia or blurred vision.  ED Course: Initial vital signs were temperature 98.2 F, pulse 110, respirations 24, BP 129/103 mmHg and O2 sat 86% on room air.  The patient received albuterol 5 mg plus ipratropium 0.5 mg neb x2 and 1000 mg of acetaminophen p.o. I added potassium and magnesium supplementation.  Lab work: CBC showed a white count 9.7, hemoglobin 15.3 g/dL platelets 270.  Sodium 131 mmol/L.  Glucose 191 and calcium 8.7 mg/dL.  The rest of the BMP values were normal.  Influenza A PCR was positive.  Imaging: 2 view chest radiograph did not show any active cardiopulmonary disease but there was chronic interstitial coarsening.  CT chest  without contrast showed scattered areas of mucoid impaction in the distal airways bilateral.  Otherwise there was no evidence of acute cardiopulmonary process.  There was an incompletely imaged right adrenal adenoma.  Please see images and full radiology report for further details.  Review of Systems: As per HPI otherwise all other systems reviewed and are negative.  Past Medical History:  Diagnosis Date   Anemia    Blood transfusion dec 2010   Fibroid tumor    Past Surgical History:  Procedure Laterality Date   ABDOMINAL HYSTERECTOMY     BACK SURGERY     uterine polyps     Social History  reports that she has been smoking cigarettes. She has a 30.00 pack-year smoking history. She has never used smokeless tobacco. She reports that she does not drink alcohol and does not use drugs.  Allergies  Allergen Reactions   Fentanyl Other (See Comments)    Low bp   Family History  Problem Relation Age of Onset   Cancer Mother    Heart disease Father    Prior to Admission medications   Medication Sig Start Date End Date Taking? Authorizing Provider  acetaminophen (TYLENOL) 500 MG tablet Take 1,000 mg by mouth every 6 (six) hours as needed for moderate pain.    [provider]  celecoxib (CELEBREX) 100 MG capsule Take 100 mg by mouth 2 (two) times daily. 06/10/19   [provider]  Clobetasol Propionate 0.05 % shampoo Apply 1 application topically daily.  06/26/19   [provider]  cyclobenzaprine (FLEXERIL) 10 MG tablet Take 1 tablet (  10 mg total) by mouth 3 (three) times daily as needed for muscle spasms. Patient not taking: Reported on 07/28/2019 07/16/18   Jola Schmidt, MD  diphenhydrAMINE (BENADRYL) 25 MG tablet Take 25 mg by mouth every 6 (six) hours as needed for itching or sleep.    [provider]  gabapentin (NEURONTIN) 300 MG capsule Take 300 mg by mouth 3 (three) times daily. 07/27/19   [provider]  HYDROcodone-acetaminophen  (NORCO/VICODIN) 5-325 MG tablet Take 1 tablet by mouth 2 (two) times daily as needed. Patient not taking: Reported on 07/28/2019 06/10/19   [provider]  HYDROcodone-acetaminophen (NORCO/VICODIN) 5-325 MG tablet Take 1 tablet by mouth every 6 (six) hours as needed for severe pain. 07/28/19   Caccavale, Sophia, PA-C  ibuprofen (ADVIL) 600 MG tablet Take 1 tablet (600 mg total) by mouth every 8 (eight) hours as needed. Patient not taking: Reported on 07/28/2019 07/16/18   Jola Schmidt, MD  meloxicam (MOBIC) 15 MG tablet Take 15 mg by mouth daily. Patient not taking: Reported on 07/28/2019 03/29/19   [provider]  oxyCODONE-acetaminophen (PERCOCET/ROXICET) 5-325 MG tablet Take 1 tablet by mouth every 4 (four) hours as needed for severe pain. Patient not taking: Reported on 07/28/2019 07/16/18   Jola Schmidt, MD  triamcinolone lotion (KENALOG) 0.1 % Apply topically daily. Patient not taking: Reported on 07/28/2019 05/06/19   [provider]  triamcinolone ointment (KENALOG) 0.1 % Apply topically 2 (two) times daily. Patient not taking: Reported on 07/28/2019 06/26/19   [provider]    Physical Exam: Vitals:   12/17/20 0900 12/17/20 0930 12/17/20 1000 12/17/20 1030  BP: 121/66 (!) 101/59 116/76 (!) 106/59  Pulse: (!) 103 (!) 104 (!) 104 97  Resp: (!) 29 (!) 24 (!) 25 (!) 22  Temp:      TempSrc:      SpO2: 95% 97% 99% 97%  Weight:      Height:        Constitutional: Acutely ill-appearing but nontoxic.  Believes Eyes: PERRL, lids and conjunctivae normal.  Injected sclera bilaterally. ENMT: Nasal cannula in place.  Mucous membranes are mildly dry. Posterior pharynx clear of any exudate or lesions. Neck: normal, supple, no masses, no thyromegaly Respiratory: Mildly tachypneic in the low 20s.  Improved air movement with mild bilateral wheezing, no crackles.  Believe no accessory muscle use.  Cardiovascular: Sinus tachycardia in the low 100s, no murmurs / rubs /  gallops. No extremity edema. 2+ pedal pulses. No carotid bruits.  Abdomen: No distention.  Bowel sounds positive.  Soft, no tenderness, no masses palpated. No hepatosplenomegaly. Musculoskeletal: Mild generalized weakness.  No clubbing / cyanosis. Good ROM, no contractures. Normal muscle tone.  Skin: no acute rashes, lesions, ulcers on very limited dermatological examination. Neurologic: CN 2-12 grossly intact. Sensation intact, DTR normal. Strength 5/5 in all 4.  Psychiatric: Normal judgment and insight. Alert and oriented x 3. Normal mood.   Labs on Admission: I have personally reviewed following labs and imaging studies  CBC: Recent Labs  Lab 12/17/20 0726  WBC 9.7  NEUTROABS 7.6  HGB 15.3*  HCT 46.8*  MCV 86.5  PLT 536    Basic Metabolic Panel: Recent Labs  Lab 12/17/20 0726  NA 131*  K 3.6  CL 98  CO2 22  GLUCOSE 191*  BUN 12  CREATININE 0.71  CALCIUM 8.7*    GFR: Estimated Creatinine Clearance: 76.4 mL/min (by C-G formula based on SCr of 0.71 mg/dL).  Liver Function Tests: No  results for input(s): AST, ALT, ALKPHOS, BILITOT, PROT, ALBUMIN in the last 168 hours.  Radiological Exams on Admission: DG Chest 2 View  Result Date: 12/17/2020 CLINICAL DATA:  Shortness of breath.  Coughing congestion. EXAM: CHEST - 2 VIEW COMPARISON:  01/07/2018 FINDINGS: The lungs are clear without focal pneumonia, edema, pneumothorax or pleural effusion. Subtle nodular density seen in the right apex, between the anterior first and second ribs. Interstitial markings are diffusely coarsened with chronic features. The cardiopericardial silhouette is within normal limits for size. The visualized bony structures of the thorax show no acute abnormality. IMPRESSION: Subtle nodular density in the right lung apex. Dedicated CT chest without contrast recommended to further evaluate. Chronic interstitial coarsening. No active cardiopulmonary disease. Electronically Signed   By: Misty Stanley M.D.    On: 12/17/2020 07:30   CT Chest Wo Contrast  Result Date: 12/17/2020 CLINICAL DATA:  Flu-like symptoms, nodule seen on chest x-ray EXAM: CT CHEST WITHOUT CONTRAST TECHNIQUE: Multidetector CT imaging of the chest was performed following the standard protocol without IV contrast. COMPARISON:  Same day chest radiograph FINDINGS: Cardiovascular: The heart is not enlarged. There is no pericardial effusion. Mild coronary artery calcifications are seen. The thoracic aorta is unremarkable. Mediastinum/Nodes: There are small calcifications in the left thyroid lobe. The esophagus is grossly unremarkable. There is no mediastinal or axillary lymphadenopathy. There is no bulky hilar adenopathy Lungs/Pleura: The trachea and central airways are patent. There are a few areas of mucoid impaction in bilateral lower lobe airways (for example 7-96). The lungs are well inflated. There is no focal consolidation or pulmonary edema. There is no pleural effusion or pneumothorax. There is a small calcified granuloma in the left lower lobe. There are no suspicious nodules. The finding on the prior chest radiograph likely reflected artifact related to overlying tissues. Upper Abdomen: There is an incompletely imaged right adrenal nodule measuring -4 Hounsfield units consistent with an adenoma. The imaged portions of the upper abdominal viscera are otherwise unremarkable. Musculoskeletal: There is no acute osseous abnormality or aggressive osseous lesion. IMPRESSION: 1. No suspicious pulmonary nodules. The finding on the chest radiograph was likely artifactual related to overlapping tissues. 2. Scattered areas of mucoid impaction in distal airways bilaterally. Otherwise, no evidence of acute cardiopulmonary process. 3. Incompletely imaged right adrenal adenoma. Electronically Signed   By: Valetta Mole M.D.   On: 12/17/2020 09:27    EKG: Independently reviewed.  Vent. rate 110 BPM PR interval 127 ms QRS duration 86 ms QT/QTcB 339/459  ms P-R-T axes 76 73 63 Sinus tachycardia Prominent P waves, nondiagnostic  Assessment/Plan Principal Problem:   Acute respiratory failure with hypoxia (HCC) In the setting   Acute influenza A Continue supplemental oxygen. Taper off after clinically possible. Scheduled neb treatments 4 times daily. Bronchodilators as needed every 4 hours. Oseltamivir 75 mg p.o. twice daily. Consider glucocorticoids if no improvement.  Active Problems:   Hyponatremia Continue normal saline infusion. Follow-up sodium level.    Hyperglycemia Recheck fasting glucose.    Hypocalcemia Repeat calcium level.    Polycythemia Smoking cessation advised.    Tobacco use Nicotine replacement therapy offered.    DVT prophylaxis: Lovenox SQ. Code Status:   Full code. Family Communication:   Disposition Plan:   Patient is from:  Home.  Anticipated DC to:  Home.  Anticipated DC date:  12/18/2020 or 12/19/2020.  Anticipated DC barriers: Clinical status.  Consults called:   Admission status:  Observation/progressive care unit.   Severity of Illness: High severity  after presenting with acute respiratory failure with hypoxia with new oxygen requirement in the setting of acute influenza A.  The patient will remain in the hospital for close monitoring and further treatment for 24 to 48 hours.  Reubin Milan MD [facility]  How to contact the [facility] Attending or Consulting provider [place] or covering provider during after hours [place], for this patient?   Check the care team in [facility] and look for a) attending/consulting [facility] provider listed and b) the [facility] team listed Log into [email or web address] and use [place]'s universal password to access. If you do not have the password, please contact the hospital operator. Locate the [facility] provider you are looking for under [facility] and page to a number that you can be directly reached. If you still have difficulty reaching the provider, please page  the [facility] (Director on Call) for the Hospitalists listed on amion for assistance.  12/17/2020, 11:34 AM   This document was prepared using [employer] and may contain some unintended transcription errors.

## 2020-12-17 NOTE — ED Notes (Signed)
Pt placed on 2LNC due to O2 90-92%. O2 increased to 96%

## 2020-12-18 DIAGNOSIS — J9601 Acute respiratory failure with hypoxia: Secondary | ICD-10-CM | POA: Diagnosis not present

## 2020-12-18 LAB — HIV ANTIBODY (ROUTINE TESTING W REFLEX): HIV Screen 4th Generation wRfx: NONREACTIVE

## 2020-12-18 MED ORDER — NICOTINE 14 MG/24HR TD PT24: 14.0000 mg | MEDICATED_PATCH | Freq: Every day | TRANSDERMAL | Status: DC

## 2020-12-18 NOTE — Progress Notes (Signed)
PROGRESS NOTE    Gabrielle Walker  XIP:[id number] DOB: 02-15-1965 DOA: 12/17/2020 PCP: [facility]   Chief Complain: Shortness of breath  Brief Narrative:  Patient is a 55 year old female with history of chronic blood loss anemia, fibroid tumor, tobacco use, currently smoking 1 pack a day who presents to the emerged part with complaints of flulike symptoms with headache, rhinorrhea, sore throat, dry cough, wheezing, shortness of breath.  She was also febrile at home.  When she came here, she was saturating 86% on room air and had to be put on supplemental oxygen.  Chest imaging did not show pneumonia.  Patient was admitted for the management of acute respiratory failure with hypoxia secondary to influenza. Assessment & Plan:   Principal Problem:   Acute respiratory failure with hypoxia (HCC) Active Problems:   Influenza A   Hyponatremia   Hyperglycemia   Hypocalcemia   Polycythemia   Tobacco use   Acute hypoxic respiratory failure : On presentation, she was hypoxic on room air and had to put on 2 L of oxygen per minute.  Chest imaging did not show pneumonia.  Continue current management.  We will try to wean the oxygen.  She is not on oxygen at home  Influenza A: Continue Tamiflu.  Continue bronchodilators as needed.  Has faint wheezing bilaterally today  Tobacco use: Smokes a pack a day.  Continue nicotine patch.  Counseled for cessation  Hyponatremia: Mild.  Continue to monitor  Hypothyroidism: On levothyroxine  Hyperlipidemia: On crestor          DVT prophylaxis:Lovenox Code Status: Full Family Communication: None at bedside Patient status:  Dispo: The patient is from: Home              Anticipated d/c is to: Home               Anticipated d/c date is: tomorrow  Consultants: None  Procedures:None  Antimicrobials:  Anti-infectives (From admission, onward)    Start     Dose/Rate Route Frequency Ordered Stop   12/17/20 1115   oseltamivir (TAMIFLU) capsule 75 mg        75 mg Oral 2 times daily 12/17/20 1108 12/22/20 0959       Subjective:  Patient seen and examined at the bedside this morning.  Hemodynamically stable.  On 2 L of oxygen.  Complains of some cough complains of severe weakness, states she is unable to go home today.  Objective: Vitals:   12/17/20 2259 12/18/20 0443 12/18/20 0700 12/18/20 0822  BP: 134/79 128/85    Pulse: 73 79    Resp: 18 18    Temp: 97.9 F (36.6 C) 98.2 F (36.8 C)    TempSrc: Oral Oral    SpO2: 95% 99% 97% 96%  Weight:      Height:        Intake/Output Summary (Last 24 hours) at 12/18/2020 1049 Last data filed at 12/18/2020 1035 Gross per 24 hour  Intake 2571.19 ml  Output --  Net 2571.19 ml   Filed Weights   12/17/20 0650  Weight: 72.6 kg    Examination:  General exam: Not in distress HEENT: PERRL Respiratory system: Faint bilateral expiratory wheezing Cardiovascular system: S1 & S2 heard, RRR.  Gastrointestinal system: Abdomen is nondistended, soft and nontender. Central nervous system: Alert and oriented Extremities: No edema, no clubbing ,no cyanosis Skin: No rashes, no ulcers,no icterus      Data Reviewed: I have personally reviewed following labs  and imaging studies  CBC: Recent Labs  Lab 12/17/20 0726  WBC 9.7  NEUTROABS 7.6  HGB 15.3*  HCT 46.8*  MCV 86.5  PLT 008   Basic Metabolic Panel: Recent Labs  Lab 12/17/20 0726  NA 131*  K 3.6  CL 98  CO2 22  GLUCOSE 191*  BUN 12  CREATININE 0.71  CALCIUM 8.7*   GFR: Estimated Creatinine Clearance: 76.4 mL/min (by C-G formula based on SCr of 0.71 mg/dL). Liver Function Tests: No results for input(s): AST, ALT, ALKPHOS, BILITOT, PROT, ALBUMIN in the last 168 hours. No results for input(s): LIPASE, AMYLASE in the last 168 hours. No results for input(s): AMMONIA in the last 168 hours. Coagulation Profile: No results for input(s): INR, PROTIME in the last 168 hours. Cardiac  Enzymes: No results for input(s): CKTOTAL, CKMB, CKMBINDEX, TROPONINI in the last 168 hours. BNP (last 3 results) No results for input(s): PROBNP in the last 8760 hours. HbA1C: No results for input(s): HGBA1C in the last 72 hours. CBG: No results for input(s): GLUCAP in the last 168 hours. Lipid Profile: No results for input(s): CHOL, HDL, LDLCALC, TRIG, CHOLHDL, LDLDIRECT in the last 72 hours. Thyroid Function Tests: No results for input(s): TSH, T4TOTAL, FREET4, T3FREE, THYROIDAB in the last 72 hours. Anemia Panel: No results for input(s): VITAMINB12, FOLATE, FERRITIN, TIBC, IRON, RETICCTPCT in the last 72 hours. Sepsis Labs: No results for input(s): PROCALCITON, LATICACIDVEN in the last 168 hours.  Recent Results (from the past 240 hour(s))  Resp Panel by RT-PCR (Flu A&B, Covid) Nasopharyngeal Swab     Status: Abnormal   Collection Time: 12/17/20  6:56 AM   Specimen: Nasopharyngeal Swab; Nasopharyngeal(NP) swabs in vial transport medium  Result Value Ref Range Status   SARS Coronavirus 2 by RT PCR NEGATIVE NEGATIVE Final    Comment: (NOTE) SARS-CoV-2 target nucleic acids are NOT DETECTED.  The SARS-CoV-2 RNA is generally detectable in upper respiratory specimens during the acute phase of infection. The lowest concentration of SARS-CoV-2 viral copies this assay can detect is 138 copies/mL. A negative result does not preclude SARS-Cov-2 infection and should not be used as the sole basis for treatment or other patient management decisions. A negative result may occur with  improper specimen collection/handling, submission of specimen other than nasopharyngeal swab, presence of viral mutation(s) within the areas targeted by this assay, and inadequate number of viral copies(<138 copies/mL). A negative result must be combined with clinical observations, patient history, and epidemiological information. The expected result is Negative.  Fact Sheet for Patients:   EntrepreneurPulse.com.au  Fact Sheet for Healthcare Providers:  IncredibleEmployment.be  This test is no t yet approved or cleared by the [place] FDA and  has been authorized for detection and/or diagnosis of SARS-CoV-2 by FDA under an Emergency Use Authorization (EUA). This EUA will remain  in effect (meaning this test can be used) for the duration of the COVID-19 declaration under Section 564(b)(1) of the Act, 21 U.S.C.section 360bbb-3(b)(1), unless the authorization is terminated  or revoked sooner.       Influenza A by PCR POSITIVE (A) NEGATIVE Final   Influenza B by PCR NEGATIVE NEGATIVE Final    Comment: (NOTE) The Xpert Xpress SARS-CoV-2/FLU/RSV plus assay is intended as an aid in the diagnosis of influenza from Nasopharyngeal swab specimens and should not be used as a sole basis for treatment. Nasal washings and aspirates are unacceptable for Xpert Xpress SARS-CoV-2/FLU/RSV testing.  Fact Sheet for Patients: EntrepreneurPulse.com.au  Fact Sheet for Healthcare Providers:  IncredibleEmployment.be  This test is not yet approved or cleared by the [place] and has been authorized for detection and/or diagnosis of SARS-CoV-2 by FDA under an Emergency Use Authorization (EUA). This EUA will remain in effect (meaning this test can be used) for the duration of the COVID-19 declaration under Section 564(b)(1) of the Act, 21 U.S.C. section 360bbb-3(b)(1), unless the authorization is terminated or revoked.  Performed at [facility], [place] [address] [place], [place] [address]          Radiology Studies: DG Chest 2 View  Result Date: 12/17/2020 CLINICAL DATA:  Shortness of breath.  Coughing congestion. EXAM: CHEST - 2 VIEW COMPARISON:  01/07/2018 FINDINGS: The lungs are clear without focal pneumonia, edema, pneumothorax or pleural effusion. Subtle nodular density seen  in the right apex, between the anterior first and second ribs. Interstitial markings are diffusely coarsened with chronic features. The cardiopericardial silhouette is within normal limits for size. The visualized bony structures of the thorax show no acute abnormality. IMPRESSION: Subtle nodular density in the right lung apex. Dedicated CT chest without contrast recommended to further evaluate. Chronic interstitial coarsening. No active cardiopulmonary disease. Electronically Signed   By: Misty Stanley M.D.   On: 12/17/2020 07:30   CT Chest Wo Contrast  Result Date: 12/17/2020 CLINICAL DATA:  Flu-like symptoms, nodule seen on chest x-ray EXAM: CT CHEST WITHOUT CONTRAST TECHNIQUE: Multidetector CT imaging of the chest was performed following the standard protocol without IV contrast. COMPARISON:  Same day chest radiograph FINDINGS: Cardiovascular: The heart is not enlarged. There is no pericardial effusion. Mild coronary artery calcifications are seen. The thoracic aorta is unremarkable. Mediastinum/Nodes: There are small calcifications in the left thyroid lobe. The esophagus is grossly unremarkable. There is no mediastinal or axillary lymphadenopathy. There is no bulky hilar adenopathy Lungs/Pleura: The trachea and central airways are patent. There are a few areas of mucoid impaction in bilateral lower lobe airways (for example 7-96). The lungs are well inflated. There is no focal consolidation or pulmonary edema. There is no pleural effusion or pneumothorax. There is a small calcified granuloma in the left lower lobe. There are no suspicious nodules. The finding on the prior chest radiograph likely reflected artifact related to overlying tissues. Upper Abdomen: There is an incompletely imaged right adrenal nodule measuring -4 Hounsfield units consistent with an adenoma. The imaged portions of the upper abdominal viscera are otherwise unremarkable. Musculoskeletal: There is no acute osseous abnormality or  aggressive osseous lesion. IMPRESSION: 1. No suspicious pulmonary nodules. The finding on the chest radiograph was likely artifactual related to overlapping tissues. 2. Scattered areas of mucoid impaction in distal airways bilaterally. Otherwise, no evidence of acute cardiopulmonary process. 3. Incompletely imaged right adrenal adenoma. Electronically Signed   By: Valetta Mole M.D.   On: 12/17/2020 09:27        Scheduled Meds:  DULoxetine  60 mg Oral QHS   enoxaparin (LOVENOX) injection  40 mg Subcutaneous Q24H   gabapentin  600 mg Oral TID   ipratropium-albuterol  3 mL Nebulization TID   levothyroxine  50 mcg Oral Daily   oseltamivir  75 mg Oral BID   rosuvastatin  20 mg Oral Daily   vitamin B-12  1,000 mcg Oral Daily   Continuous Infusions:   LOS: 0 days    Time spent: 35 mins.More than 50% of that time was spent in counseling and/or coordination of care.      Shelly Coss, MD [facility] P12/02/2020, 10:49  AM

## 2020-12-18 NOTE — Plan of Care (Signed)

## 2020-12-19 DIAGNOSIS — J9601 Acute respiratory failure with hypoxia: Secondary | ICD-10-CM | POA: Diagnosis not present

## 2020-12-19 MED ORDER — OSELTAMIVIR PHOSPHATE 75 MG PO CAPS: 75.0000 mg | ORAL_CAPSULE | Freq: Two times a day (BID) | ORAL | 0 refills | Status: AC

## 2020-12-19 MED ORDER — ALBUTEROL SULFATE HFA 108 (90 BASE) MCG/ACT IN AERS: 2.0000 | INHALATION_SPRAY | Freq: Four times a day (QID) | RESPIRATORY_TRACT | 0 refills | Status: AC | PRN

## 2020-12-19 NOTE — Discharge Summary (Signed)
Physician Discharge Summary  Gabrielle Walker YHC:[id number] DOB: December 16, 1965 DOA: 12/17/2020  PCP: [facility]  Admit date: 12/17/2020 Discharge date: 12/19/2020  Admitted From: Home Disposition:  Home  Discharge Condition:Stable CODE STATUS:FULL Diet recommendation: Heart Healthy   Brief/Interim Summary: Patient is a 55 year old female with history of chronic blood loss anemia, fibroid tumor, tobacco use, currently smoking 1 pack a day who presents to the emerged part with complaints of flulike symptoms with headache, rhinorrhea, sore throat, dry cough, wheezing, shortness of breath.  She was also febrile at home.  When she came here, she was saturating 86% on room air and had to be put on supplemental oxygen.  Chest imaging did not show pneumonia.  Patient was admitted for the management of acute respiratory failure with hypoxia secondary to influenza.  Patient's overall status continues to improve.  Currently she is on room air.  Feels much better today.  Medically stable for discharge.  Following problems were addressed during her hospitalization:  Acute hypoxic respiratory failure : On presentation, she was hypoxic on room air and had to put on 2 L of oxygen per minute.  Chest imaging did not show pneumonia.  Today she is on room air   Influenza A: Continue Tamiflu.    Tobacco use: Smokes a pack a day.  Counseled for cessation   Hyponatremia: Mild.  Continue to monitor as an outpatient   Hypothyroidism: On levothyroxine   Hyperlipidemia: On crestor    Discharge Diagnoses:  Principal Problem:   Acute respiratory failure with hypoxia ([place]) Active Problems:   Influenza A   Hyponatremia   Hyperglycemia   Hypocalcemia   Polycythemia   Tobacco use    Discharge Instructions  Discharge Instructions     Diet - low sodium heart healthy   Complete by: As directed    Discharge instructions   Complete by: As directed    1)Please take prescribed  medication as instructed 2)Follow up with your PCP in a week   Increase activity slowly   Complete by: As directed       Allergies as of 12/19/2020       Reactions   Fentanyl Other (See Comments)   Low bp        Medication List     STOP taking these medications    cyclobenzaprine 10 MG tablet Commonly known as: FLEXERIL   ibuprofen 600 MG tablet Commonly known as: ADVIL   triamcinolone lotion 0.1 % Commonly known as: KENALOG   triamcinolone ointment 0.1 % Commonly known as: KENALOG       TAKE these medications    acetaminophen 500 MG tablet Commonly known as: TYLENOL Take 1,000 mg by mouth every 6 (six) hours as needed for moderate pain.   cholecalciferol 25 MCG (1000 UNIT) tablet Commonly known as: VITAMIN D3 Take 1,000 Units by mouth daily.   diphenhydrAMINE 25 MG tablet Commonly known as: BENADRYL Take 25 mg by mouth every 6 (six) hours as needed for itching or sleep.   DULoxetine 60 MG capsule Commonly known as: CYMBALTA Take 60 mg by mouth at bedtime.   gabapentin 600 MG tablet Commonly known as: NEURONTIN Take 600 mg by mouth 3 (three) times daily.   HYDROcodone-acetaminophen 5-325 MG tablet Commonly known as: NORCO/VICODIN Take 1 tablet by mouth every 6 (six) hours as needed for severe pain. What changed: when to take this   levothyroxine 50 MCG tablet Commonly known as: SYNTHROID Take 50 mcg by mouth daily.  oseltamivir 75 MG capsule Commonly known as: TAMIFLU Take 1 capsule (75 mg total) by mouth 2 (two) times daily for 3 days.   rosuvastatin 20 MG tablet Commonly known as: CRESTOR Take 20 mg by mouth daily.   Tremfya 100 MG/ML Sosy Generic drug: Guselkumab Inject 100 mg into the skin every 8 (eight) weeks.   vitamin B-12 1000 MCG tablet Commonly known as: CYANOCOBALAMIN Take 1,000 mcg by mouth daily.        Follow-up Information     [place]. Schedule an appointment as soon as possible  for a visit in 1 week(s).   Specialty: Family Medicine Contact information: [address] [place] [address] [phone]                Allergies  Allergen Reactions   Fentanyl Other (See Comments)    Low bp    Consultations: None   Procedures/Studies: DG Chest 2 View  Result Date: 12/17/2020 CLINICAL DATA:  Shortness of breath.  Coughing congestion. EXAM: CHEST - 2 VIEW COMPARISON:  01/07/2018 FINDINGS: The lungs are clear without focal pneumonia, edema, pneumothorax or pleural effusion. Subtle nodular density seen in the right apex, between the anterior first and second ribs. Interstitial markings are diffusely coarsened with chronic features. The cardiopericardial silhouette is within normal limits for size. The visualized bony structures of the thorax show no acute abnormality. IMPRESSION: Subtle nodular density in the right lung apex. Dedicated CT chest without contrast recommended to further evaluate. Chronic interstitial coarsening. No active cardiopulmonary disease. Electronically Signed   By: Misty Stanley M.D.   On: 12/17/2020 07:30   CT Chest Wo Contrast  Result Date: 12/17/2020 CLINICAL DATA:  Flu-like symptoms, nodule seen on chest x-ray EXAM: CT CHEST WITHOUT CONTRAST TECHNIQUE: Multidetector CT imaging of the chest was performed following the standard protocol without IV contrast. COMPARISON:  Same day chest radiograph FINDINGS: Cardiovascular: The heart is not enlarged. There is no pericardial effusion. Mild coronary artery calcifications are seen. The thoracic aorta is unremarkable. Mediastinum/Nodes: There are small calcifications in the left thyroid lobe. The esophagus is grossly unremarkable. There is no mediastinal or axillary lymphadenopathy. There is no bulky hilar adenopathy Lungs/Pleura: The trachea and central airways are patent. There are a few areas of mucoid impaction in bilateral lower lobe airways (for example 7-96). The lungs are well  inflated. There is no focal consolidation or pulmonary edema. There is no pleural effusion or pneumothorax. There is a small calcified granuloma in the left lower lobe. There are no suspicious nodules. The finding on the prior chest radiograph likely reflected artifact related to overlying tissues. Upper Abdomen: There is an incompletely imaged right adrenal nodule measuring -4 Hounsfield units consistent with an adenoma. The imaged portions of the upper abdominal viscera are otherwise unremarkable. Musculoskeletal: There is no acute osseous abnormality or aggressive osseous lesion. IMPRESSION: 1. No suspicious pulmonary nodules. The finding on the chest radiograph was likely artifactual related to overlapping tissues. 2. Scattered areas of mucoid impaction in distal airways bilaterally. Otherwise, no evidence of acute cardiopulmonary process. 3. Incompletely imaged right adrenal adenoma. Electronically Signed   By: Valetta Mole M.D.   On: 12/17/2020 09:27      Subjective: Patient seen and examined at the bedside this morning.  Hemodynamically stable for discharge today  Discharge Exam: Vitals:   12/19/20 0605 12/19/20 0754  BP: 128/82   Pulse: 68   Resp: 18   Temp: 98.4 F (36.9 C)  SpO2: 96% 95%   Vitals:   12/18/20 1952 12/18/20 2017 12/19/20 0605 12/19/20 0754  BP:  118/74 128/82   Pulse:  82 68   Resp:  18 18   Temp:  98.5 F (36.9 C) 98.4 F (36.9 C)   TempSrc:  Oral Oral   SpO2: 97% 95% 96% 95%  Weight:      Height:        General: Pt is alert, awake, not in acute distress Cardiovascular: RRR, S1/S2 +, no rubs, no gallops Respiratory: CTA bilaterally, no wheezing, no rhonchi Abdominal: Soft, NT, ND, bowel sounds + Extremities: no edema, no cyanosis    The results of significant diagnostics from this hospitalization (including imaging, microbiology, ancillary and laboratory) are listed below for reference.     Microbiology: Recent Results (from the past 240 hour(s))   Resp Panel by RT-PCR (Flu A&B, Covid) Nasopharyngeal Swab     Status: Abnormal   Collection Time: 12/17/20  6:56 AM   Specimen: Nasopharyngeal Swab; Nasopharyngeal(NP) swabs in vial transport medium  Result Value Ref Range Status   SARS Coronavirus 2 by RT PCR NEGATIVE NEGATIVE Final    Comment: (NOTE) SARS-CoV-2 target nucleic acids are NOT DETECTED.  The SARS-CoV-2 RNA is generally detectable in upper respiratory specimens during the acute phase of infection. The lowest concentration of SARS-CoV-2 viral copies this assay can detect is 138 copies/mL. A negative result does not preclude SARS-Cov-2 infection and should not be used as the sole basis for treatment or other patient management decisions. A negative result may occur with  improper specimen collection/handling, submission of specimen other than nasopharyngeal swab, presence of viral mutation(s) within the areas targeted by this assay, and inadequate number of viral copies(<138 copies/mL). A negative result must be combined with clinical observations, patient history, and epidemiological information. The expected result is Negative.  Fact Sheet for Patients:  EntrepreneurPulse.com.au  Fact Sheet for Healthcare Providers:  IncredibleEmployment.be  This test is no t yet approved or cleared by the [place] FDA and  has been authorized for detection and/or diagnosis of SARS-CoV-2 by FDA under an Emergency Use Authorization (EUA). This EUA will remain  in effect (meaning this test can be used) for the duration of the COVID-19 declaration under Section 564(b)(1) of the Act, 21 U.S.C.section 360bbb-3(b)(1), unless the authorization is terminated  or revoked sooner.       Influenza A by PCR POSITIVE (A) NEGATIVE Final   Influenza B by PCR NEGATIVE NEGATIVE Final    Comment: (NOTE) The Xpert Xpress SARS-CoV-2/FLU/RSV plus assay is intended as an aid in the diagnosis of influenza from  Nasopharyngeal swab specimens and should not be used as a sole basis for treatment. Nasal washings and aspirates are unacceptable for Xpert Xpress SARS-CoV-2/FLU/RSV testing.  Fact Sheet for Patients: EntrepreneurPulse.com.au  Fact Sheet for Healthcare Providers: IncredibleEmployment.be  This test is not yet approved or cleared by the [place] FDA and has been authorized for detection and/or diagnosis of SARS-CoV-2 by FDA under an Emergency Use Authorization (EUA). This EUA will remain in effect (meaning this test can be used) for the duration of the COVID-19 declaration under Section 564(b)(1) of the Act, 21 U.S.C. section 360bbb-3(b)(1), unless the authorization is terminated or revoked.  Performed at [facility], [place] [address], [place], [place] [address]      Labs: BNP (last 3 results) No results for input(s): BNP in the last 8760 hours. Basic Metabolic Panel: Recent Labs  Lab 12/17/20 [id number]  NA 131*  K 3.6  CL 98  CO2 22  GLUCOSE 191*  BUN 12  CREATININE 0.71  CALCIUM 8.7*   Liver Function Tests: No results for input(s): AST, ALT, ALKPHOS, BILITOT, PROT, ALBUMIN in the last 168 hours. No results for input(s): LIPASE, AMYLASE in the last 168 hours. No results for input(s): AMMONIA in the last 168 hours. CBC: Recent Labs  Lab 12/17/20 0726  WBC 9.7  NEUTROABS 7.6  HGB 15.3*  HCT 46.8*  MCV 86.5  PLT 270   Cardiac Enzymes: No results for input(s): CKTOTAL, CKMB, CKMBINDEX, TROPONINI in the last 168 hours. BNP: Invalid input(s): POCBNP CBG: No results for input(s): GLUCAP in the last 168 hours. D-Dimer No results for input(s): DDIMER in the last 72 hours. Hgb A1c No results for input(s): HGBA1C in the last 72 hours. Lipid Profile No results for input(s): CHOL, HDL, LDLCALC, TRIG, CHOLHDL, LDLDIRECT in the last 72 hours. Thyroid function studies No results for input(s): TSH, T4TOTAL, T3FREE,  THYROIDAB in the last 72 hours.  Invalid input(s): FREET3 Anemia work up No results for input(s): VITAMINB12, FOLATE, FERRITIN, TIBC, IRON, RETICCTPCT in the last 72 hours. Urinalysis    Component Value Date/Time   COLORURINE YELLOW 01/07/2018 2114   APPEARANCEUR CLEAR 01/07/2018 2114   LABSPEC 1.013 01/07/2018 2114   PHURINE 7.0 01/07/2018 2114   GLUCOSEU NEGATIVE 01/07/2018 2114   HGBUR LARGE (A) 01/07/2018 2114   BILIRUBINUR NEGATIVE 01/07/2018 2114   KETONESUR NEGATIVE 01/07/2018 2114   PROTEINUR NEGATIVE 01/07/2018 2114   UROBILINOGEN 0.2 08/01/2010 1230   NITRITE NEGATIVE 01/07/2018 2114   LEUKOCYTESUR NEGATIVE 01/07/2018 2114   Sepsis Labs Invalid input(s): PROCALCITONIN,  WBC,  LACTICIDVEN Microbiology Recent Results (from the past 240 hour(s))  Resp Panel by RT-PCR (Flu A&B, Covid) Nasopharyngeal Swab     Status: Abnormal   Collection Time: 12/17/20  6:56 AM   Specimen: Nasopharyngeal Swab; Nasopharyngeal(NP) swabs in vial transport medium  Result Value Ref Range Status   SARS Coronavirus 2 by RT PCR NEGATIVE NEGATIVE Final    Comment: (NOTE) SARS-CoV-2 target nucleic acids are NOT DETECTED.  The SARS-CoV-2 RNA is generally detectable in upper respiratory specimens during the acute phase of infection. The lowest concentration of SARS-CoV-2 viral copies this assay can detect is 138 copies/mL. A negative result does not preclude SARS-Cov-2 infection and should not be used as the sole basis for treatment or other patient management decisions. A negative result may occur with  improper specimen collection/handling, submission of specimen other than nasopharyngeal swab, presence of viral mutation(s) within the areas targeted by this assay, and inadequate number of viral copies(<138 copies/mL). A negative result must be combined with clinical observations, patient history, and epidemiological information. The expected result is Negative.  Fact Sheet for Patients:   EntrepreneurPulse.com.au  Fact Sheet for Healthcare Providers:  IncredibleEmployment.be  This test is no t yet approved or cleared by the [place] FDA and  has been authorized for detection and/or diagnosis of SARS-CoV-2 by FDA under an Emergency Use Authorization (EUA). This EUA will remain  in effect (meaning this test can be used) for the duration of the COVID-19 declaration under Section 564(b)(1) of the Act, 21 U.S.C.section 360bbb-3(b)(1), unless the authorization is terminated  or revoked sooner.       Influenza A by PCR POSITIVE (A) NEGATIVE Final   Influenza B by PCR NEGATIVE NEGATIVE Final    Comment: (NOTE) The Xpert Xpress SARS-CoV-2/FLU/RSV plus assay is intended as an aid in  the diagnosis of influenza from Nasopharyngeal swab specimens and should not be used as a sole basis for treatment. Nasal washings and aspirates are unacceptable for Xpert Xpress SARS-CoV-2/FLU/RSV testing.  Fact Sheet for Patients: EntrepreneurPulse.com.au  Fact Sheet for Healthcare Providers: IncredibleEmployment.be  This test is not yet approved or cleared by the [place] FDA and has been authorized for detection and/or diagnosis of SARS-CoV-2 by FDA under an Emergency Use Authorization (EUA). This EUA will remain in effect (meaning this test can be used) for the duration of the COVID-19 declaration under Section 564(b)(1) of the Act, 21 U.S.C. section 360bbb-3(b)(1), unless the authorization is terminated or revoked.  Performed at [facility], [place] [address], [place], [place] [address]     Please note: You were cared for by a hospitalist during your hospital stay. Once you are discharged, your primary care physician will handle any further medical issues. Please note that NO REFILLS for any discharge medications will be authorized once you are discharged, as it is imperative that you  return to your primary care physician (or establish a relationship with a primary care physician if you do not have one) for your post hospital discharge needs so that they can reassess your need for medications and monitor your lab values.    Time coordinating discharge: 40 minutes  SIGNED:   Shelly Coss, MD  [facility] 12/19/2020, 9:24 AM Pager [phone]  If 7PM-7AM, please contact night-coverage [email or web address] Password [email or web address]

## 2020-12-19 NOTE — Progress Notes (Signed)
Patient discharging home.  IV removed - WNL.  Reviewed AVS and medications, instructed to follow up with PCP in 1 week.  Patient verbalizes understanding, no questions at this time.  Patient in NAD waiting for son to pick her up.

## 2020-12-19 NOTE — Discharge Instructions (Signed)
If you wish to quit smoking, help is available.  For free tobacco cessation program offerings call the [facility] at (336) 832-0894 or Live Well Line at (336) 586-4000. You may also visit [email or web address] or email [email or web address]  for more information on other programs. ° ° °

## 2021-02-10 ENCOUNTER — Emergency Department (HOSPITAL_BASED_OUTPATIENT_CLINIC_OR_DEPARTMENT_OTHER)
Admission: EM | Admit: 2021-02-10 | Discharge: 2021-02-10 | Disposition: A | Discharge status: Home / Self Care | Payer: BLUE CROSS/BLUE SHIELD | Attending: Emergency Medicine | Admitting: Emergency Medicine

## 2021-02-10 ENCOUNTER — Other Ambulatory Visit: Payer: Self-pay

## 2021-02-10 DIAGNOSIS — R410 Disorientation, unspecified: Secondary | ICD-10-CM | POA: Diagnosis not present

## 2021-02-10 DIAGNOSIS — Z20822 Contact with and (suspected) exposure to covid-19: Secondary | ICD-10-CM | POA: Diagnosis not present

## 2021-02-10 DIAGNOSIS — R197 Diarrhea, unspecified: Secondary | ICD-10-CM | POA: Insufficient documentation

## 2021-02-10 DIAGNOSIS — I10 Essential (primary) hypertension: Secondary | ICD-10-CM | POA: Diagnosis not present

## 2021-02-10 LAB — URINALYSIS, ROUTINE W REFLEX MICROSCOPIC
Bilirubin Urine: NEGATIVE
Glucose, UA: NEGATIVE mg/dL
Ketones, ur: NEGATIVE mg/dL
Leukocytes,Ua: NEGATIVE
Nitrite: NEGATIVE
Protein, ur: NEGATIVE mg/dL
Specific Gravity, Urine: 1.014 (ref 1.005–1.030)
pH: 6.5 (ref 5.0–8.0)

## 2021-02-10 LAB — BASIC METABOLIC PANEL
Anion gap: 10 (ref 5–15)
BUN: 14 mg/dL (ref 6–20)
CO2: 26 mmol/L (ref 22–32)
Calcium: 9.5 mg/dL (ref 8.9–10.3)
Chloride: 104 mmol/L (ref 98–111)
Creatinine, Ser: 0.76 mg/dL (ref 0.44–1.00)
GFR, Estimated: >60 mL/min (ref >60)
Glucose, Bld: 109 mg/dL — ABNORMAL HIGH (ref 70–99)
Potassium: 3.6 mmol/L (ref 3.5–5.1)
Sodium: 140 mmol/L (ref 135–145)

## 2021-02-10 LAB — CBC
HCT: 42.1 % (ref 36.0–46.0)
Hemoglobin: 14 g/dL (ref 12.0–15.0)
MCH: 28.2 pg (ref 26.0–34.0)
MCHC: 33.3 g/dL (ref 30.0–36.0)
MCV: 84.9 fL (ref 80.0–100.0)
Platelets: 322 10*3/uL (ref 150–400)
RBC: 4.96 MIL/uL (ref 3.87–5.11)
RDW: 14.7 % (ref 11.5–15.5)
WBC: 9.1 10*3/uL (ref 4.0–10.5)
nRBC: 0 % (ref 0.0–0.2)

## 2021-02-10 LAB — T4, FREE: Free T4: 0.71 ng/dL (ref 0.61–1.12)

## 2021-02-10 LAB — RESP PANEL BY RT-PCR (FLU A&B, COVID) ARPGX2
Influenza A by PCR: NEGATIVE
Influenza B by PCR: NEGATIVE
SARS Coronavirus 2 by RT PCR: NEGATIVE

## 2021-02-10 LAB — TSH: TSH: 3.069 u[IU]/mL (ref 0.350–4.500)

## 2021-02-10 NOTE — ED Provider Notes (Signed)
[facility] EMERGENCY DEPT Provider Note   CSN: [id number] Arrival date & time: 02/10/21  1438     History  Chief Complaint  Patient presents with   Diarrhea    Gabrielle Walker is a 56 y.o. female.   Diarrhea Patient presents feeling bad.  Had called her PCP.  History of thyroid disease and and arthritis.  Had injection for her arthritis a few days ago.  States she has had this 5 times previously not feel bad.  Now however began to feel bad.  Has had diarrhea.  Some nausea but no fevers.  Decreased oral intake.  States she feels more confused.  States that she has a little bit of a fog.  Some difficulty remembering things have been going on.  No headache.  No abdominal pain.  States that she called her PCP and was sent in to rule of thyroid storm.    Home Medications Prior to Admission medications   Medication Sig Start Date End Date Taking? Authorizing Provider  acetaminophen (TYLENOL) 500 MG tablet Take 1,000 mg by mouth every 6 (six) hours as needed for moderate pain.    [provider]  albuterol (VENTOLIN HFA) 108 (90 Base) MCG/ACT inhaler Inhale 2 puffs into the lungs every 6 (six) hours as needed for wheezing or shortness of breath. 12/19/20   Shelly Coss, MD  cholecalciferol (VITAMIN D3) 25 MCG (1000 UNIT) tablet Take 1,000 Units by mouth daily.    [provider]  diphenhydrAMINE (BENADRYL) 25 MG tablet Take 25 mg by mouth every 6 (six) hours as needed for itching or sleep.    [provider]  DULoxetine (CYMBALTA) 60 MG capsule Take 60 mg by mouth at bedtime. 08/17/20   [provider]  gabapentin (NEURONTIN) 600 MG tablet Take 600 mg by mouth 3 (three) times daily. 07/27/19   [provider]  HYDROcodone-acetaminophen (NORCO/VICODIN) 5-325 MG tablet Take 1 tablet by mouth every 6 (six) hours as needed for severe pain. Patient taking differently: Take 1 tablet by mouth every 8 (eight) hours as needed for severe pain.  07/28/19   Caccavale, Sophia, PA-C  levothyroxine (SYNTHROID) 50 MCG tablet Take 50 mcg by mouth daily. 11/16/20   [provider]  rosuvastatin (CRESTOR) 20 MG tablet Take 20 mg by mouth daily. 11/27/20   [provider]  TREMFYA 100 MG/ML SOSY Inject 100 mg into the skin every 8 (eight) weeks. 12/07/20   [provider]  vitamin B-12 (CYANOCOBALAMIN) 1000 MCG tablet Take 1,000 mcg by mouth daily.    [provider]      Allergies    Fentanyl    Review of Systems   Review of Systems  Constitutional:  Positive for appetite change and fatigue.  HENT:  Negative for congestion.   Cardiovascular:  Positive for palpitations. Negative for chest pain.  Gastrointestinal:  Positive for diarrhea.  Genitourinary:  Negative for flank pain.  Musculoskeletal:  Negative for back pain.  Skin:  Negative for rash.  Neurological:  Negative for weakness.   Physical Exam Updated Vital Signs BP (!) 148/93    Pulse 74    Temp 98.3 F (36.8 C) (Oral)    Resp 12    SpO2 100%  Physical Exam Vitals and nursing note reviewed.  HENT:     Head: Normocephalic.  Eyes:     Pupils: Pupils are equal, round, and reactive to light.  Pulmonary:     Breath sounds: No wheezing or rhonchi.  Abdominal:  Tenderness: There is no abdominal tenderness.  Musculoskeletal:        General: No tenderness.     Cervical back: Neck supple.  Skin:    General: Skin is warm.  Neurological:     Mental Status: She is alert and oriented to person, place, and time.    ED Results / Procedures / Treatments   Labs (all labs ordered are listed, but only abnormal results are displayed) Labs Reviewed  BASIC METABOLIC PANEL - Abnormal; Notable for the following components:      Result Value   Glucose, Bld 109 (*)    All other components within normal limits  URINALYSIS, ROUTINE W REFLEX MICROSCOPIC - Abnormal; Notable for the following components:   Hgb urine dipstick SMALL (*)    Bacteria,  UA RARE (*)    All other components within normal limits  RESP PANEL BY RT-PCR (FLU A&B, COVID) ARPGX2  CBC  TSH  T3, FREE  T4, FREE    EKG EKG Interpretation  Date/Time:  Wednesday February 10 2021 14:57:17 EST Ventricular Rate:  75 PR Interval:  164 QRS Duration: 96 QT Interval:  408 QTC Calculation: 456 R Axis:   78 Text Interpretation: Sinus rhythm Probable inferior infarct, old No significant change since last tracing Confirmed by Davonna Belling [phone]) on 02/10/2021 3:10:41 PM  Radiology No results found.  Procedures Procedures    Medications Ordered in ED Medications - No data to display  ED Course/ Medical Decision Making/ A&P                           Medical Decision Making Amount and/or Complexity of Data Reviewed Labs: ordered.   Patient presents feeling bad.  Has had diarrhea.  Felt that the last few days.  Recently had injection treatment for her psoriasis.  States she has had this medicine previously without difficulty.  States she called her PCP and was told to come in to rule out thyroid storm.  Not tachycardic here.  Mild hypertension.  Does have history of hypertension.  States he has been a little more confused.  Nonfocal exam.  Lab work reviewed and reassuring.  Although thyroid studies still pending at time of discharge.  Doubt thyroid storm clinically.  Can be followed with PCP.  No dysuria.  Negative flu and COVID testing.  Discharge home.  Differential diagnosis for feeling bad is long and includes life-threatening conditions such as COVID dehydration sepsis       Final Clinical Impression(s) / ED Diagnoses Final diagnoses:  Diarrhea, unspecified type    Rx / DC Orders ED Discharge Orders     None         Davonna Belling, MD 02/11/21 1239

## 2021-02-10 NOTE — ED Triage Notes (Signed)
Patinet reports she woke up at 0200 this morning feeling like a storm was going on in her body. Patient reports she has a hx of thyroid disorders and was sent to rule out thyroid storm. Patient denies shortness of breath or chest pains

## 2021-02-10 NOTE — Discharge Instructions (Addendum)
Your thyroid levels are still pending.  Your doctor can follow them.  Give a call tomorrow.  Try to keep yourself hydrated.

## 2021-02-11 LAB — T3, FREE: T3, Free: 2.6 pg/mL (ref 2.0–4.4)

## 2022-01-03 DIAGNOSIS — Z20822 Contact with and (suspected) exposure to covid-19: Secondary | ICD-10-CM | POA: Diagnosis not present

## 2022-01-03 DIAGNOSIS — J22 Unspecified acute lower respiratory infection: Secondary | ICD-10-CM | POA: Diagnosis not present

## 2022-03-18 DIAGNOSIS — E559 Vitamin D deficiency, unspecified: Secondary | ICD-10-CM | POA: Diagnosis not present

## 2022-03-23 DIAGNOSIS — M961 Postlaminectomy syndrome, not elsewhere classified: Secondary | ICD-10-CM | POA: Diagnosis not present

## 2022-03-23 DIAGNOSIS — M533 Sacrococcygeal disorders, not elsewhere classified: Secondary | ICD-10-CM | POA: Diagnosis not present

## 2022-04-21 DIAGNOSIS — M533 Sacrococcygeal disorders, not elsewhere classified: Secondary | ICD-10-CM | POA: Diagnosis not present

## 2022-04-21 DIAGNOSIS — G8929 Other chronic pain: Secondary | ICD-10-CM | POA: Diagnosis not present

## 2022-04-21 DIAGNOSIS — M7918 Myalgia, other site: Secondary | ICD-10-CM | POA: Diagnosis not present

## 2022-06-02 DIAGNOSIS — M533 Sacrococcygeal disorders, not elsewhere classified: Secondary | ICD-10-CM | POA: Diagnosis not present

## 2022-06-02 DIAGNOSIS — Z981 Arthrodesis status: Secondary | ICD-10-CM | POA: Diagnosis not present

## 2022-06-02 DIAGNOSIS — M47816 Spondylosis without myelopathy or radiculopathy, lumbar region: Secondary | ICD-10-CM | POA: Diagnosis not present

## 2022-06-02 DIAGNOSIS — G894 Chronic pain syndrome: Secondary | ICD-10-CM | POA: Diagnosis not present

## 2022-06-02 DIAGNOSIS — M7918 Myalgia, other site: Secondary | ICD-10-CM | POA: Diagnosis not present

## 2022-06-20 DIAGNOSIS — M47816 Spondylosis without myelopathy or radiculopathy, lumbar region: Secondary | ICD-10-CM | POA: Diagnosis not present

## 2022-06-23 DIAGNOSIS — M545 Low back pain, unspecified: Secondary | ICD-10-CM | POA: Diagnosis not present

## 2022-06-23 DIAGNOSIS — E538 Deficiency of other specified B group vitamins: Secondary | ICD-10-CM | POA: Diagnosis not present

## 2022-06-23 DIAGNOSIS — Z720 Tobacco use: Secondary | ICD-10-CM | POA: Diagnosis not present

## 2022-06-23 DIAGNOSIS — Z23 Encounter for immunization: Secondary | ICD-10-CM | POA: Diagnosis not present

## 2022-06-23 DIAGNOSIS — Z0000 Encounter for general adult medical examination without abnormal findings: Secondary | ICD-10-CM | POA: Diagnosis not present

## 2022-06-23 DIAGNOSIS — E559 Vitamin D deficiency, unspecified: Secondary | ICD-10-CM | POA: Diagnosis not present

## 2022-06-23 DIAGNOSIS — E119 Type 2 diabetes mellitus without complications: Secondary | ICD-10-CM | POA: Diagnosis not present

## 2022-06-23 DIAGNOSIS — F419 Anxiety disorder, unspecified: Secondary | ICD-10-CM | POA: Diagnosis not present

## 2022-06-23 DIAGNOSIS — Z122 Encounter for screening for malignant neoplasm of respiratory organs: Secondary | ICD-10-CM | POA: Diagnosis not present

## 2022-06-23 DIAGNOSIS — L409 Psoriasis, unspecified: Secondary | ICD-10-CM | POA: Diagnosis not present

## 2022-06-23 DIAGNOSIS — E039 Hypothyroidism, unspecified: Secondary | ICD-10-CM | POA: Diagnosis not present

## 2022-06-23 DIAGNOSIS — Z8619 Personal history of other infectious and parasitic diseases: Secondary | ICD-10-CM | POA: Diagnosis not present

## 2022-06-23 DIAGNOSIS — E785 Hyperlipidemia, unspecified: Secondary | ICD-10-CM | POA: Diagnosis not present

## 2022-07-04 DIAGNOSIS — M47816 Spondylosis without myelopathy or radiculopathy, lumbar region: Secondary | ICD-10-CM | POA: Diagnosis not present

## 2022-07-05 ENCOUNTER — Other Ambulatory Visit: Payer: Self-pay

## 2022-07-05 ENCOUNTER — Encounter (HOSPITAL_COMMUNITY): Payer: Self-pay

## 2022-07-05 ENCOUNTER — Emergency Department (HOSPITAL_COMMUNITY)
Admission: EM | Admit: 2022-07-05 | Discharge: 2022-07-05 | Disposition: A | Discharge status: Home / Self Care | Payer: Medicaid Other | Attending: Emergency Medicine | Admitting: Emergency Medicine

## 2022-07-05 DIAGNOSIS — M5441 Lumbago with sciatica, right side: Secondary | ICD-10-CM | POA: Diagnosis not present

## 2022-07-05 DIAGNOSIS — G8929 Other chronic pain: Secondary | ICD-10-CM

## 2022-07-05 MED ORDER — KETOROLAC TROMETHAMINE 15 MG/ML IJ SOLN
15.0000 mg | Freq: Once | INTRAMUSCULAR | Status: AC
  Administered 2022-07-05: 15 mg via INTRAMUSCULAR
  Filled 2022-07-05: qty 1

## 2022-07-05 MED ORDER — METHYLPREDNISOLONE 4 MG PO TBPK: ORAL_TABLET | ORAL | 0 refills | Status: DC

## 2022-07-05 MED ORDER — DEXAMETHASONE SODIUM PHOSPHATE 10 MG/ML IJ SOLN
10.0000 mg | Freq: Once | INTRAMUSCULAR | Status: AC
  Administered 2022-07-05: 10 mg via INTRAMUSCULAR
  Filled 2022-07-05: qty 1

## 2022-07-05 NOTE — Discharge Instructions (Signed)
You were evaluated today for an exacerbation of your low back pain/right leg pain.  You were given a steroid injection and a Toradol injection.  I have prescribed a steroid Dosepak to help with inflammation.  You may resume your diclofenac tomorrow.  Please follow-up with your primary care and pain management teams for further evaluation and management as needed.  If you develop any life-threatening symptoms please return to the emergency department.

## 2022-07-05 NOTE — ED Triage Notes (Signed)
Patient is here for evaluation of right sided sciatic nerve pain. Reports that she has back pain that radiates down her right leg. Pt reports having a nerve block done yesterday for bilateral legs, but pain is still on right side.

## 2022-07-05 NOTE — ED Provider Notes (Signed)
[place] [facility] AT [facility] Provider Note   CSN: [id number] Arrival date & time: 07/05/22  1038     History  Chief Complaint  Patient presents with   Back Pain    Gabrielle Walker is a 57 y.o. female.  Patient with history of lumbar fusion, lumbar spondylosis presents to the ED with right sided low back pain that radiates down to her posterior right leg started at 0230 today. Patient states she had a bilateral nerve block yesterday at 0730 and had no pain after the procedure. Patient describes her pain as "sharp/shooting" and rates it 8/10 while sitting. Patient says she can walk and move but with severe pain. Patient states she has normal strength and sensation. Patient denies numbness/tingling, groin numbness, urinary incontinence/retention. Patient denies history of cancer, IVDU or recent long-term steroid use.  HPI     Home Medications Prior to Admission medications   Medication Sig Start Date End Date Taking? Authorizing Provider  methylPREDNISolone (MEDROL DOSEPAK) 4 MG TBPK tablet Take as directed per package instructions 07/05/22  Yes Darrick Grinder, PA-C  acetaminophen (TYLENOL) 500 MG tablet Take 1,000 mg by mouth every 6 (six) hours as needed for moderate pain.    [provider]  albuterol (VENTOLIN HFA) 108 (90 Base) MCG/ACT inhaler Inhale 2 puffs into the lungs every 6 (six) hours as needed for wheezing or shortness of breath. 12/19/20   Burnadette Pop, MD  cholecalciferol (VITAMIN D3) 25 MCG (1000 UNIT) tablet Take 1,000 Units by mouth daily.    [provider]  diphenhydrAMINE (BENADRYL) 25 MG tablet Take 25 mg by mouth every 6 (six) hours as needed for itching or sleep.    [provider]  DULoxetine (CYMBALTA) 60 MG capsule Take 60 mg by mouth at bedtime. 08/17/20   [provider]  gabapentin (NEURONTIN) 600 MG tablet Take 600 mg by mouth 3 (three) times daily. 07/27/19   [provider]   HYDROcodone-acetaminophen (NORCO/VICODIN) 5-325 MG tablet Take 1 tablet by mouth every 6 (six) hours as needed for severe pain. Patient taking differently: Take 1 tablet by mouth every 8 (eight) hours as needed for severe pain. 07/28/19   Caccavale, Sophia, PA-C  levothyroxine (SYNTHROID) 50 MCG tablet Take 50 mcg by mouth daily. 11/16/20   [provider]  rosuvastatin (CRESTOR) 20 MG tablet Take 20 mg by mouth daily. 11/27/20   [provider]  TREMFYA 100 MG/ML SOSY Inject 100 mg into the skin every 8 (eight) weeks. 12/07/20   [provider]  vitamin B-12 (CYANOCOBALAMIN) 1000 MCG tablet Take 1,000 mcg by mouth daily.    [provider]      Allergies    Fentanyl    Review of Systems   Review of Systems  Physical Exam Updated Vital Signs BP 117/72 (BP Location: Right Arm)   Pulse 100   Temp 98.8 F (37.1 C) (Oral)   Resp 19   Ht 5\' 7"  (1.702 m)   Wt 84.4 kg   SpO2 97%   BMI 29.13 kg/m  Physical Exam Vitals and nursing note reviewed.  HENT:     Head: Normocephalic and atraumatic.  Eyes:     Pupils: Pupils are equal, round, and reactive to light.  Pulmonary:     Effort: Pulmonary effort is normal. No respiratory distress.  Musculoskeletal:        General: No swelling, tenderness, deformity or signs of injury. Normal range of motion.  Cervical back: Normal range of motion.     Comments: Patient complains of pain with active hip flexion  Skin:    General: Skin is dry.  Neurological:     Mental Status: She is alert.     Sensory: No sensory deficit.     Motor: No weakness.  Psychiatric:        Speech: Speech normal.        Behavior: Behavior normal.     ED Results / Procedures / Treatments   Labs (all labs ordered are listed, but only abnormal results are displayed) Labs Reviewed - No data to display  EKG None  Radiology No results found.  Procedures Procedures    Medications Ordered in ED Medications  ketorolac  (TORADOL) 15 MG/ML injection 15 mg (15 mg Intramuscular Given 07/05/22 1224)  dexamethasone (DECADRON) injection 10 mg (10 mg Intramuscular Given 07/05/22 1224)    ED Course/ Medical Decision Making/ A&P                             Medical Decision Making  Patient presents to the emergency department complaints of right-sided radicular pain.  Patient states he had a nerve block yesterday which provided short-term relief but the nerve block wore off overnight and she began to have severe pain in the right leg.  She is able to ambulate.  She states she is able to walk but does not want to lift her leg if she is not going to walk as it does cause an increase in pain.  This is not changed from her baseline.  She sees pain management for the same.  I see no indication at this time for repeat imaging today.  The patient had no trauma and complains of an exacerbation of her chronic condition.  She does have comorbidities including chronic back pain.  I did review outside documentation from pain management  I ordered the patient Toradol and Decadron for her pain and inflammation.  Upon reassessment she was feeling somewhat better.  Patient's pain seems consistent with her known chronic back pain/radiculopathy.  Plan to discharge with prescription for Medrol Dosepak.  Patient currently has diclofenac at home and will resume diclofenac tomorrow.  Patient will follow-up with pain management for further evaluation and management as needed.  There is no indication for admission at this time        Final Clinical Impression(s) / ED Diagnoses Final diagnoses:  Chronic bilateral low back pain with right-sided sciatica    Rx / DC Orders ED Discharge Orders          Ordered    methylPREDNISolone (MEDROL DOSEPAK) 4 MG TBPK tablet        07/05/22 1214              Pamala Duffel 07/05/22 1230    Pricilla Loveless, MD 07/07/22 [phone]

## 2022-07-08 DIAGNOSIS — Z720 Tobacco use: Secondary | ICD-10-CM | POA: Diagnosis not present

## 2022-07-08 DIAGNOSIS — Z87891 Personal history of nicotine dependence: Secondary | ICD-10-CM | POA: Diagnosis not present

## 2022-07-08 DIAGNOSIS — Z1231 Encounter for screening mammogram for malignant neoplasm of breast: Secondary | ICD-10-CM | POA: Diagnosis not present

## 2022-07-15 DIAGNOSIS — E041 Nontoxic single thyroid nodule: Secondary | ICD-10-CM | POA: Diagnosis not present

## 2022-07-25 DIAGNOSIS — M47816 Spondylosis without myelopathy or radiculopathy, lumbar region: Secondary | ICD-10-CM | POA: Diagnosis not present

## 2022-08-08 DIAGNOSIS — M47816 Spondylosis without myelopathy or radiculopathy, lumbar region: Secondary | ICD-10-CM | POA: Diagnosis not present

## 2022-08-17 DIAGNOSIS — N6341 Unspecified lump in right breast, subareolar: Secondary | ICD-10-CM | POA: Diagnosis not present

## 2022-08-29 DIAGNOSIS — E785 Hyperlipidemia, unspecified: Secondary | ICD-10-CM | POA: Diagnosis not present

## 2022-09-27 DIAGNOSIS — G8929 Other chronic pain: Secondary | ICD-10-CM | POA: Diagnosis not present

## 2022-09-27 DIAGNOSIS — E538 Deficiency of other specified B group vitamins: Secondary | ICD-10-CM | POA: Diagnosis not present

## 2022-09-27 DIAGNOSIS — E039 Hypothyroidism, unspecified: Secondary | ICD-10-CM | POA: Diagnosis not present

## 2022-09-27 DIAGNOSIS — E041 Nontoxic single thyroid nodule: Secondary | ICD-10-CM | POA: Diagnosis not present

## 2022-09-27 DIAGNOSIS — F419 Anxiety disorder, unspecified: Secondary | ICD-10-CM | POA: Diagnosis not present

## 2022-09-27 DIAGNOSIS — E119 Type 2 diabetes mellitus without complications: Secondary | ICD-10-CM | POA: Diagnosis not present

## 2022-09-27 DIAGNOSIS — F32A Depression, unspecified: Secondary | ICD-10-CM | POA: Diagnosis not present

## 2022-09-27 DIAGNOSIS — E785 Hyperlipidemia, unspecified: Secondary | ICD-10-CM | POA: Diagnosis not present

## 2022-09-27 DIAGNOSIS — E559 Vitamin D deficiency, unspecified: Secondary | ICD-10-CM | POA: Diagnosis not present

## 2022-09-27 DIAGNOSIS — F5104 Psychophysiologic insomnia: Secondary | ICD-10-CM | POA: Diagnosis not present

## 2022-09-27 DIAGNOSIS — M545 Low back pain, unspecified: Secondary | ICD-10-CM | POA: Diagnosis not present

## 2022-10-04 DIAGNOSIS — E538 Deficiency of other specified B group vitamins: Secondary | ICD-10-CM | POA: Diagnosis not present

## 2022-10-04 DIAGNOSIS — E559 Vitamin D deficiency, unspecified: Secondary | ICD-10-CM | POA: Diagnosis not present

## 2022-10-04 DIAGNOSIS — E119 Type 2 diabetes mellitus without complications: Secondary | ICD-10-CM | POA: Diagnosis not present

## 2022-10-04 DIAGNOSIS — E039 Hypothyroidism, unspecified: Secondary | ICD-10-CM | POA: Diagnosis not present

## 2022-10-04 DIAGNOSIS — Z23 Encounter for immunization: Secondary | ICD-10-CM | POA: Diagnosis not present

## 2022-10-25 DIAGNOSIS — Z981 Arthrodesis status: Secondary | ICD-10-CM | POA: Diagnosis not present

## 2022-10-25 DIAGNOSIS — M7918 Myalgia, other site: Secondary | ICD-10-CM | POA: Diagnosis not present

## 2022-10-25 DIAGNOSIS — G8929 Other chronic pain: Secondary | ICD-10-CM | POA: Diagnosis not present

## 2022-10-25 DIAGNOSIS — M47816 Spondylosis without myelopathy or radiculopathy, lumbar region: Secondary | ICD-10-CM | POA: Diagnosis not present

## 2022-10-25 DIAGNOSIS — M533 Sacrococcygeal disorders, not elsewhere classified: Secondary | ICD-10-CM | POA: Diagnosis not present

## 2022-11-04 DIAGNOSIS — H5213 Myopia, bilateral: Secondary | ICD-10-CM | POA: Diagnosis not present

## 2022-11-08 DIAGNOSIS — F32A Depression, unspecified: Secondary | ICD-10-CM | POA: Diagnosis not present

## 2022-11-08 DIAGNOSIS — E041 Nontoxic single thyroid nodule: Secondary | ICD-10-CM | POA: Diagnosis not present

## 2022-11-08 DIAGNOSIS — M545 Low back pain, unspecified: Secondary | ICD-10-CM | POA: Diagnosis not present

## 2022-11-08 DIAGNOSIS — F5104 Psychophysiologic insomnia: Secondary | ICD-10-CM | POA: Diagnosis not present

## 2022-11-08 DIAGNOSIS — G8929 Other chronic pain: Secondary | ICD-10-CM | POA: Diagnosis not present

## 2022-11-08 DIAGNOSIS — E785 Hyperlipidemia, unspecified: Secondary | ICD-10-CM | POA: Diagnosis not present

## 2022-11-08 DIAGNOSIS — F419 Anxiety disorder, unspecified: Secondary | ICD-10-CM | POA: Diagnosis not present

## 2022-11-08 DIAGNOSIS — E039 Hypothyroidism, unspecified: Secondary | ICD-10-CM | POA: Diagnosis not present

## 2022-11-08 DIAGNOSIS — Z720 Tobacco use: Secondary | ICD-10-CM | POA: Diagnosis not present

## 2022-11-08 DIAGNOSIS — E119 Type 2 diabetes mellitus without complications: Secondary | ICD-10-CM | POA: Diagnosis not present

## 2022-11-08 DIAGNOSIS — M791 Myalgia, unspecified site: Secondary | ICD-10-CM | POA: Diagnosis not present

## 2022-11-09 DIAGNOSIS — M533 Sacrococcygeal disorders, not elsewhere classified: Secondary | ICD-10-CM | POA: Diagnosis not present

## 2022-11-09 DIAGNOSIS — Z01812 Encounter for preprocedural laboratory examination: Secondary | ICD-10-CM | POA: Diagnosis not present

## 2022-12-21 DIAGNOSIS — H5213 Myopia, bilateral: Secondary | ICD-10-CM | POA: Diagnosis not present

## 2022-12-28 DIAGNOSIS — E039 Hypothyroidism, unspecified: Secondary | ICD-10-CM | POA: Diagnosis not present

## 2023-02-03 DIAGNOSIS — G8929 Other chronic pain: Secondary | ICD-10-CM | POA: Diagnosis not present

## 2023-02-03 DIAGNOSIS — M533 Sacrococcygeal disorders, not elsewhere classified: Secondary | ICD-10-CM | POA: Diagnosis not present

## 2023-02-03 DIAGNOSIS — Z981 Arthrodesis status: Secondary | ICD-10-CM | POA: Diagnosis not present

## 2023-02-03 DIAGNOSIS — G544 Lumbosacral root disorders, not elsewhere classified: Secondary | ICD-10-CM | POA: Diagnosis not present

## 2023-02-14 DIAGNOSIS — L409 Psoriasis, unspecified: Secondary | ICD-10-CM | POA: Diagnosis not present

## 2023-02-14 DIAGNOSIS — E119 Type 2 diabetes mellitus without complications: Secondary | ICD-10-CM | POA: Diagnosis not present

## 2023-02-14 DIAGNOSIS — E559 Vitamin D deficiency, unspecified: Secondary | ICD-10-CM | POA: Diagnosis not present

## 2023-02-14 DIAGNOSIS — F5104 Psychophysiologic insomnia: Secondary | ICD-10-CM | POA: Diagnosis not present

## 2023-02-14 DIAGNOSIS — E039 Hypothyroidism, unspecified: Secondary | ICD-10-CM | POA: Diagnosis not present

## 2023-02-14 DIAGNOSIS — E041 Nontoxic single thyroid nodule: Secondary | ICD-10-CM | POA: Diagnosis not present

## 2023-02-14 DIAGNOSIS — E785 Hyperlipidemia, unspecified: Secondary | ICD-10-CM | POA: Diagnosis not present

## 2023-02-14 DIAGNOSIS — M545 Low back pain, unspecified: Secondary | ICD-10-CM | POA: Diagnosis not present

## 2023-02-14 DIAGNOSIS — F419 Anxiety disorder, unspecified: Secondary | ICD-10-CM | POA: Diagnosis not present

## 2023-02-14 DIAGNOSIS — Z720 Tobacco use: Secondary | ICD-10-CM | POA: Diagnosis not present

## 2023-02-14 DIAGNOSIS — E538 Deficiency of other specified B group vitamins: Secondary | ICD-10-CM | POA: Diagnosis not present

## 2023-02-14 DIAGNOSIS — J019 Acute sinusitis, unspecified: Secondary | ICD-10-CM | POA: Diagnosis not present

## 2023-02-17 DIAGNOSIS — G544 Lumbosacral root disorders, not elsewhere classified: Secondary | ICD-10-CM | POA: Diagnosis not present

## 2023-03-10 DIAGNOSIS — H539 Unspecified visual disturbance: Secondary | ICD-10-CM | POA: Diagnosis not present

## 2023-03-10 DIAGNOSIS — E119 Type 2 diabetes mellitus without complications: Secondary | ICD-10-CM | POA: Diagnosis not present

## 2023-03-10 DIAGNOSIS — R350 Frequency of micturition: Secondary | ICD-10-CM | POA: Diagnosis not present

## 2023-04-11 DIAGNOSIS — M545 Low back pain, unspecified: Secondary | ICD-10-CM | POA: Diagnosis not present

## 2023-04-11 DIAGNOSIS — E119 Type 2 diabetes mellitus without complications: Secondary | ICD-10-CM | POA: Diagnosis not present

## 2023-04-11 DIAGNOSIS — G8929 Other chronic pain: Secondary | ICD-10-CM | POA: Diagnosis not present

## 2023-04-11 DIAGNOSIS — H539 Unspecified visual disturbance: Secondary | ICD-10-CM | POA: Diagnosis not present

## 2023-04-26 DIAGNOSIS — M47816 Spondylosis without myelopathy or radiculopathy, lumbar region: Secondary | ICD-10-CM | POA: Diagnosis not present

## 2023-04-26 DIAGNOSIS — M7918 Myalgia, other site: Secondary | ICD-10-CM | POA: Diagnosis not present

## 2023-04-26 DIAGNOSIS — G8929 Other chronic pain: Secondary | ICD-10-CM | POA: Diagnosis not present

## 2023-06-09 DIAGNOSIS — M47816 Spondylosis without myelopathy or radiculopathy, lumbar region: Secondary | ICD-10-CM | POA: Diagnosis not present

## 2023-06-23 DIAGNOSIS — M47816 Spondylosis without myelopathy or radiculopathy, lumbar region: Secondary | ICD-10-CM | POA: Diagnosis not present

## 2023-06-27 DIAGNOSIS — G8929 Other chronic pain: Secondary | ICD-10-CM | POA: Diagnosis not present

## 2023-06-27 DIAGNOSIS — Z122 Encounter for screening for malignant neoplasm of respiratory organs: Secondary | ICD-10-CM | POA: Diagnosis not present

## 2023-06-27 DIAGNOSIS — E041 Nontoxic single thyroid nodule: Secondary | ICD-10-CM | POA: Diagnosis not present

## 2023-06-27 DIAGNOSIS — E559 Vitamin D deficiency, unspecified: Secondary | ICD-10-CM | POA: Diagnosis not present

## 2023-06-27 DIAGNOSIS — E538 Deficiency of other specified B group vitamins: Secondary | ICD-10-CM | POA: Diagnosis not present

## 2023-06-27 DIAGNOSIS — M545 Low back pain, unspecified: Secondary | ICD-10-CM | POA: Diagnosis not present

## 2023-06-27 DIAGNOSIS — E785 Hyperlipidemia, unspecified: Secondary | ICD-10-CM | POA: Diagnosis not present

## 2023-06-27 DIAGNOSIS — H539 Unspecified visual disturbance: Secondary | ICD-10-CM | POA: Diagnosis not present

## 2023-06-27 DIAGNOSIS — Z720 Tobacco use: Secondary | ICD-10-CM | POA: Diagnosis not present

## 2023-06-27 DIAGNOSIS — E119 Type 2 diabetes mellitus without complications: Secondary | ICD-10-CM | POA: Diagnosis not present

## 2023-06-27 DIAGNOSIS — E039 Hypothyroidism, unspecified: Secondary | ICD-10-CM | POA: Diagnosis not present

## 2023-06-27 DIAGNOSIS — F419 Anxiety disorder, unspecified: Secondary | ICD-10-CM | POA: Diagnosis not present

## 2023-06-29 DIAGNOSIS — D1801 Hemangioma of skin and subcutaneous tissue: Secondary | ICD-10-CM | POA: Diagnosis not present

## 2023-06-29 DIAGNOSIS — L738 Other specified follicular disorders: Secondary | ICD-10-CM | POA: Diagnosis not present

## 2023-06-29 DIAGNOSIS — L821 Other seborrheic keratosis: Secondary | ICD-10-CM | POA: Diagnosis not present

## 2023-06-29 DIAGNOSIS — L820 Inflamed seborrheic keratosis: Secondary | ICD-10-CM | POA: Diagnosis not present

## 2023-06-29 DIAGNOSIS — Z5181 Encounter for therapeutic drug level monitoring: Secondary | ICD-10-CM | POA: Diagnosis not present

## 2023-06-29 DIAGNOSIS — D239 Other benign neoplasm of skin, unspecified: Secondary | ICD-10-CM | POA: Diagnosis not present

## 2023-06-29 DIAGNOSIS — L814 Other melanin hyperpigmentation: Secondary | ICD-10-CM | POA: Diagnosis not present

## 2023-06-29 DIAGNOSIS — L918 Other hypertrophic disorders of the skin: Secondary | ICD-10-CM | POA: Diagnosis not present

## 2023-06-29 DIAGNOSIS — L409 Psoriasis, unspecified: Secondary | ICD-10-CM | POA: Diagnosis not present

## 2023-06-29 DIAGNOSIS — L720 Epidermal cyst: Secondary | ICD-10-CM | POA: Diagnosis not present

## 2023-06-30 DIAGNOSIS — Z5181 Encounter for therapeutic drug level monitoring: Secondary | ICD-10-CM | POA: Diagnosis not present

## 2023-07-03 DIAGNOSIS — Z87891 Personal history of nicotine dependence: Secondary | ICD-10-CM | POA: Diagnosis not present

## 2023-07-03 DIAGNOSIS — E042 Nontoxic multinodular goiter: Secondary | ICD-10-CM | POA: Diagnosis not present

## 2023-07-17 DIAGNOSIS — Z1231 Encounter for screening mammogram for malignant neoplasm of breast: Secondary | ICD-10-CM | POA: Diagnosis not present

## 2023-07-24 DIAGNOSIS — M47816 Spondylosis without myelopathy or radiculopathy, lumbar region: Secondary | ICD-10-CM | POA: Diagnosis not present

## 2023-08-07 DIAGNOSIS — M47816 Spondylosis without myelopathy or radiculopathy, lumbar region: Secondary | ICD-10-CM | POA: Diagnosis not present

## 2023-08-15 DIAGNOSIS — E039 Hypothyroidism, unspecified: Secondary | ICD-10-CM | POA: Diagnosis not present

## 2023-09-27 DIAGNOSIS — G4700 Insomnia, unspecified: Secondary | ICD-10-CM | POA: Diagnosis not present

## 2023-09-27 DIAGNOSIS — E039 Hypothyroidism, unspecified: Secondary | ICD-10-CM | POA: Diagnosis not present

## 2023-09-27 DIAGNOSIS — E559 Vitamin D deficiency, unspecified: Secondary | ICD-10-CM | POA: Diagnosis not present

## 2023-09-27 DIAGNOSIS — F32A Depression, unspecified: Secondary | ICD-10-CM | POA: Diagnosis not present

## 2023-09-27 DIAGNOSIS — G8929 Other chronic pain: Secondary | ICD-10-CM | POA: Diagnosis not present

## 2023-09-27 DIAGNOSIS — E119 Type 2 diabetes mellitus without complications: Secondary | ICD-10-CM | POA: Diagnosis not present

## 2023-09-27 DIAGNOSIS — E041 Nontoxic single thyroid nodule: Secondary | ICD-10-CM | POA: Diagnosis not present

## 2023-09-27 DIAGNOSIS — E538 Deficiency of other specified B group vitamins: Secondary | ICD-10-CM | POA: Diagnosis not present

## 2023-09-27 DIAGNOSIS — F419 Anxiety disorder, unspecified: Secondary | ICD-10-CM | POA: Diagnosis not present

## 2023-09-27 DIAGNOSIS — D72829 Elevated white blood cell count, unspecified: Secondary | ICD-10-CM | POA: Diagnosis not present

## 2023-09-27 DIAGNOSIS — M545 Low back pain, unspecified: Secondary | ICD-10-CM | POA: Diagnosis not present

## 2023-09-27 DIAGNOSIS — E785 Hyperlipidemia, unspecified: Secondary | ICD-10-CM | POA: Diagnosis not present

## 2023-10-20 DIAGNOSIS — M47816 Spondylosis without myelopathy or radiculopathy, lumbar region: Secondary | ICD-10-CM | POA: Diagnosis not present

## 2023-10-20 DIAGNOSIS — M533 Sacrococcygeal disorders, not elsewhere classified: Secondary | ICD-10-CM | POA: Diagnosis not present

## 2023-10-20 DIAGNOSIS — G8929 Other chronic pain: Secondary | ICD-10-CM | POA: Diagnosis not present

## 2023-10-20 DIAGNOSIS — Z9889 Other specified postprocedural states: Secondary | ICD-10-CM | POA: Diagnosis not present

## 2023-10-20 DIAGNOSIS — M7918 Myalgia, other site: Secondary | ICD-10-CM | POA: Diagnosis not present

## 2023-11-20 DIAGNOSIS — M533 Sacrococcygeal disorders, not elsewhere classified: Secondary | ICD-10-CM | POA: Diagnosis not present

## 2023-12-12 DIAGNOSIS — Z981 Arthrodesis status: Secondary | ICD-10-CM | POA: Diagnosis not present

## 2023-12-12 DIAGNOSIS — G8929 Other chronic pain: Secondary | ICD-10-CM | POA: Diagnosis not present

## 2023-12-12 DIAGNOSIS — M47816 Spondylosis without myelopathy or radiculopathy, lumbar region: Secondary | ICD-10-CM | POA: Diagnosis not present

## 2023-12-12 DIAGNOSIS — M533 Sacrococcygeal disorders, not elsewhere classified: Secondary | ICD-10-CM | POA: Diagnosis not present

## 2023-12-29 DIAGNOSIS — M7918 Myalgia, other site: Secondary | ICD-10-CM | POA: Diagnosis not present

## 2023-12-29 DIAGNOSIS — G8929 Other chronic pain: Secondary | ICD-10-CM | POA: Diagnosis not present

## 2023-12-29 DIAGNOSIS — Z01812 Encounter for preprocedural laboratory examination: Secondary | ICD-10-CM | POA: Diagnosis not present

## 2024-02-21 NOTE — Telephone Encounter (Signed)
 Insulin Solostar was refilled already today

## 2024-02-21 NOTE — Progress Notes (Unsigned)
 " [facility] Telephone:([phone]   Fax:([phone]) [phone]  INITIAL CONSULT NOTE  Patient Care Team: [facility]CP - General (Family Medicine)  Hematological/Oncological History # Leukocytosis # Polycythemia   CHIEF COMPLAINTS/PURPOSE OF CONSULTATION:  Elevated WBC/Hgb   HISTORY OF PRESENTING ILLNESS:  Gabrielle Walker 59 y.o. female with medical history significant for ***  On review of the previous records ***  On exam today ***  MEDICAL HISTORY:  Past Medical History:  Diagnosis Date   Anemia    Blood transfusion dec 2010   Fibroid tumor     SURGICAL HISTORY: Past Surgical History:  Procedure Laterality Date   ABDOMINAL HYSTERECTOMY     BACK SURGERY     uterine polyps      SOCIAL HISTORY: Social History   Socioeconomic History   Marital status: Married    Spouse name: Not on file   Number of children: Not on file   Years of education: Not on file   Highest education level: Not on file  Occupational History   Not on file  Tobacco Use   Smoking status: Every Day    Current packs/day: 1.00    Average packs/day: 1 pack/day for 30.0 years (30.0 ttl pk-yrs)    Types: Cigarettes   Smokeless tobacco: Never  Vaping Use   Vaping status: Never Used  Substance and Sexual Activity   Alcohol use: No   Drug use: No   Sexual activity: Yes    Birth control/protection: Surgical  Other Topics Concern   Not on file  Social History Narrative   Not on file   Social Drivers of Health   Tobacco Use: High Risk (01/17/2024)   Received from [facility]   Patient History    Smoking Tobacco Use: Every Day    Smokeless Tobacco Use: Never    Passive Exposure: Current  Financial Resource Strain: High Risk (03/17/2021)   Received from [facility] visits prior to 03/19/2022., [facility]   Overall Financial Resource Strain (CARDIA)    Difficulty of Paying Living Expenses: Hard  Food Insecurity:  High Risk (06/08/2023)   Received from [facility]   Epic    Within the past 12 months, you worried that your food would run out before you got money to buy more: Often true    Within the past 12 months, the food you bought just didn't last and you didn't have money to get more. : Often true  Transportation Needs: No Transportation Needs (06/08/2023)   Received from [facility]    In the past 12 months, has lack of reliable transportation kept you from medical appointments, meetings, work or from getting things needed for daily living? : No  Physical Activity: Sufficiently Active (03/17/2021)   Received from [facility] visits prior to 03/19/2022., [facility]   Exercise Vital Sign    On average, how many days per week do you engage in moderate to strenuous exercise (like a brisk walk)?: 5 days    On average, how many minutes do you engage in exercise at this level?: 30 min  Stress: Stress Concern Present (03/17/2021)   Received from [facility] visits prior to 03/19/2022., [facility]   [employer] of Occupational Health - Occupational Stress Questionnaire    Feeling of Stress : To some extent  Social Connections: Socially Integrated (03/17/2021)   Received from [facility]  visits prior to 03/19/2022., [facility]   Social Connection and Isolation Panel    In a typical week, how many times do you talk on the phone with family, friends, or neighbors?: Three times a week    How often do you get together with friends or relatives?: Once a week    How often do you attend church or religious services?: More than 4 times per year    Do you belong to any clubs or organizations such as church groups, unions, fraternal or athletic groups, or school groups?: Yes    How often do you attend meetings of the clubs or organizations you belong to?: 1 to 4 times per year    Are you married, widowed, divorced, separated,  never married, or living with a partner?: Married  Intimate Partner Violence: Unknown (03/17/2021)   Received from [facility] visits prior to 03/19/2022.   Epic    Within the last year, have you been afraid of your partner or ex-partner?: No    Within the last year, have you been humiliated or emotionally abused in other ways by your partner or ex-partner?: Patient refused    Within the last year, have you been kicked, hit, slapped, or otherwise physically hurt by your partner or ex-partner?: No    Within the last year, have you been raped or forced to have any kind of sexual activity by your partner or ex-partner?: No  Depression (PHQ2-9): Not on file  Alcohol Screen: Not on file  Housing: Medium Risk (06/08/2023)   Received from [facility]   Epic    What is your living situation today?: I have a place to live today, but I am worried about losing it in the future    Think about the place you live. Do you have problems with any of the following? Choose all that apply:: Mold  Utilities: Medium Risk (06/08/2023)   Received from [facility]   Utilities    In the past 12 months has the electric, gas, oil, or water company threatened to shut off services in your home? : Yes  Health Literacy: Not on file    FAMILY HISTORY: Family History  Problem Relation Age of Onset   Cancer Mother    Heart disease Father     ALLERGIES:  is allergic to fentanyl.  MEDICATIONS:  Current Outpatient Medications  Medication Sig Dispense Refill   acetaminophen  (TYLENOL ) 500 MG tablet Take 1,000 mg by mouth every 6 (six) hours as needed for moderate pain.     albuterol  (VENTOLIN  HFA) 108 (90 Base) MCG/ACT inhaler Inhale 2 puffs into the lungs every 6 (six) hours as needed for wheezing or shortness of breath. 8 g 0   cholecalciferol (VITAMIN D3) 25 MCG (1000 UNIT) tablet Take 1,000 Units by mouth daily.     diphenhydrAMINE (BENADRYL) 25 MG tablet Take 25 mg by mouth every 6 (six) hours  as needed for itching or sleep.     DULoxetine  (CYMBALTA ) 60 MG capsule Take 60 mg by mouth at bedtime.     gabapentin  (NEURONTIN ) 600 MG tablet Take 600 mg by mouth 3 (three) times daily.     HYDROcodone -acetaminophen  (NORCO/VICODIN) 5-325 MG tablet Take 1 tablet by mouth every 6 (six) hours as needed for severe pain. (Patient taking differently: Take 1 tablet by mouth every 8 (eight) hours as needed for severe pain.) 8 tablet 0   levothyroxine  (SYNTHROID ) 50 MCG tablet Take 50 mcg by mouth daily.  methylPREDNISolone  (MEDROL  DOSEPAK) 4 MG TBPK tablet Take as directed per package instructions 21 tablet 0   rosuvastatin  (CRESTOR ) 20 MG tablet Take 20 mg by mouth daily.     TREMFYA 100 MG/ML SOSY Inject 100 mg into the skin every 8 (eight) weeks.     vitamin B-12 (CYANOCOBALAMIN ) 1000 MCG tablet Take 1,000 mcg by mouth daily.     No current facility-administered medications for this visit.    REVIEW OF SYSTEMS:   Constitutional: ( - ) fevers, ( - )  chills , ( - ) night sweats Eyes: ( - ) blurriness of vision, ( - ) double vision, ( - ) watery eyes Ears, nose, mouth, throat, and face: ( - ) mucositis, ( - ) sore throat Respiratory: ( - ) cough, ( - ) dyspnea, ( - ) wheezes Cardiovascular: ( - ) palpitation, ( - ) chest discomfort, ( - ) lower extremity swelling Gastrointestinal:  ( - ) nausea, ( - ) heartburn, ( - ) change in bowel habits Skin: ( - ) abnormal skin rashes Lymphatics: ( - ) new lymphadenopathy, ( - ) easy bruising Neurological: ( - ) numbness, ( - ) tingling, ( - ) new weaknesses Behavioral/Psych: ( - ) mood change, ( - ) new changes  All other systems were reviewed with the patient and are negative.  PHYSICAL EXAMINATION:  There were no vitals filed for this visit. There were no vitals filed for this visit.  GENERAL: well appearing *** in NAD  SKIN: skin color, texture, turgor are normal, no rashes or significant lesions EYES: conjunctiva are pink and non-injected,  sclera clear LUNGS: clear to auscultation and percussion with normal breathing effort HEART: regular rate & rhythm and no murmurs and no lower extremity edema Musculoskeletal: no cyanosis of digits and no clubbing  PSYCH: alert & oriented x 3, fluent speech NEURO: no focal motor/sensory deficits  LABORATORY DATA:  I have reviewed the data as listed    Latest Ref Rng & Units 02/10/2021    4:10 PM 12/17/2020    7:26 AM 01/07/2018    9:14 PM  CBC  WBC 4.0 - 10.5 K/uL 9.1  9.7  11.0   Hemoglobin 12.0 - 15.0 g/dL 85.9  84.6  85.4   Hematocrit 36.0 - 46.0 % 42.1  46.8  45.3   Platelets 150 - 400 K/uL 322  270  265        Latest Ref Rng & Units 02/10/2021    4:10 PM 12/17/2020    7:26 AM 01/07/2018    9:14 PM  CMP  Glucose 70 - 99 mg/dL 890  808  885   BUN 6 - 20 mg/dL 14  12  14    Creatinine 0.44 - 1.00 mg/dL 9.23  9.28  9.09   Sodium 135 - 145 mmol/L 140  131  137   Potassium 3.5 - 5.1 mmol/L 3.6  3.6  4.9   Chloride 98 - 111 mmol/L 104  98  100   CO2 22 - 32 mmol/L 26  22  25    Calcium  8.9 - 10.3 mg/dL 9.5  8.7  9.3   Total Protein 6.5 - 8.1 g/dL   7.5   Total Bilirubin 0.3 - 1.2 mg/dL   0.2   Alkaline Phos 38 - 126 U/L   73   AST 15 - 41 U/L   41   ALT 0 - 44 U/L   56      ASSESSMENT & PLAN ***  After review of the labs, review of the records, and discussion with the patient the patients findings are most consistent with ***  There are two types of polycythemia, Primary polycythemia and secondary polycythemia. Primary polycythemia is overproduction of red blood cells due to a driver mutation. The most common mutation is the JAK2 [id number] (95% of cases), but there are other mutations which can cause this disorder. Primary polycythemia is a myeloproliferative neoplasm which may require cytoreductive therapy to decrease risk of thrombosis. This can consist of medications or phlebotomy to drive down the red blood cell counts. Secondary polycythemia is polycythemia driven by low  oxygen levels. This represents an appropriate response of the body attempting to increase red cell volume. Causes of secondary polycythemia include smoking (most common), obstructive sleep apnea (OSA), or living at altitude. This can also be caused by testosterone supplementation. Certain thalassemias can present with marked erythrocytosis , but normal hemoglobin. Secondary polycythemia does not have the same level of thrombotic risk and therefore does not require cytoreductive therapy or phlebotomy.    #Polycythemia  --workup to include CBC, CMP, reticulocyte count, and erythropoietin level  --patient is an active smoker. Encourage smoking cessation.     --patient is a non-smoker and does not use any testosterone containing products  --patient has symptoms concerning for sleep apnea, will refer to sleep center for polysomnography  --no signs of symptoms concerning for OSA  --will order MPN workup to include JAK2 with reflex and BCR/ABL FISH  --no indication for MPN workup at this time  --RTC in 6 months or sooner if indicated by the above labs.    # Leukocytosis, Neutrophilic Predominant  --today will order CBC, CMP, and LDH  --inflammatory workup with ESR and CRP  --encourage smoking cessation  --recommend testing for OSA  --patient is a nonsmoker and has no signs/symptoms of OSA ***  --no focal signs concerning for infection.   --no indication for an MPN workup at this time  --will proceed with MPN workup to include JAK2 w/ reflex and BCR/ABL FISH ***  --RTC in 6 months or sooner if indicated by above workup.      No orders of the defined types were placed in this encounter.   All questions were answered. The patient knows to call the clinic with any problems, questions or concerns.  A total of more than 60 minutes were spent on this encounter with face-to-face time and non-face-to-face time, including preparing to see the patient, ordering tests and/or medications, counseling the  patient and coordination of care as outlined above.   Gabrielle Walker Kidney, MD Department of Hematology/Oncology [facility] Phone: [phone] Pager: [phone] Email: [email or web address]  02/21/2024 7:52 PM  "

## 2024-02-22 ENCOUNTER — Inpatient Hospital Stay: Admitting: Hematology and Oncology

## 2024-02-22 ENCOUNTER — Other Ambulatory Visit: Payer: Self-pay | Admitting: *Deleted

## 2024-02-22 ENCOUNTER — Inpatient Hospital Stay

## 2024-02-22 VITALS — BP 131/87 | HR 83 | Temp 97.9°F | Resp 16 | Wt 196.4 lb

## 2024-02-22 DIAGNOSIS — D751 Secondary polycythemia: Secondary | ICD-10-CM

## 2024-02-22 LAB — CMP (CANCER CENTER ONLY)
ALT: 37 U/L (ref 0–44)
AST: 25 U/L (ref 15–41)
Albumin: 4.6 g/dL (ref 3.5–5.0)
Alkaline Phosphatase: 101 U/L (ref 38–126)
Anion gap: 12 (ref 5–15)
BUN: 9 mg/dL (ref 6–20)
CO2: 29 mmol/L (ref 22–32)
Calcium: 10.5 mg/dL — ABNORMAL HIGH (ref 8.9–10.3)
Chloride: 99 mmol/L (ref 98–111)
Creatinine: 0.83 mg/dL (ref 0.44–1.00)
GFR, Estimated: >60 mL/min
Glucose, Bld: 147 mg/dL — ABNORMAL HIGH (ref 70–99)
Potassium: 4.4 mmol/L (ref 3.5–5.1)
Sodium: 141 mmol/L (ref 135–145)
Total Bilirubin: 0.2 mg/dL (ref 0.0–1.2)
Total Protein: 7.8 g/dL (ref 6.5–8.1)

## 2024-02-22 LAB — CBC WITH DIFFERENTIAL (CANCER CENTER ONLY)
Abs Immature Granulocytes: 0.08 10*3/uL — ABNORMAL HIGH (ref 0.00–0.07)
Basophils Absolute: 0.1 10*3/uL (ref 0.0–0.1)
Basophils Relative: 0 %
Eosinophils Absolute: 0.4 10*3/uL (ref 0.0–0.5)
Eosinophils Relative: 3 %
HCT: 46.7 % — ABNORMAL HIGH (ref 36.0–46.0)
Hemoglobin: 15.5 g/dL — ABNORMAL HIGH (ref 12.0–15.0)
Immature Granulocytes: 1 %
Lymphocytes Relative: 19 %
Lymphs Abs: 2.5 10*3/uL (ref 0.7–4.0)
MCH: 28 pg (ref 26.0–34.0)
MCHC: 33.2 g/dL (ref 30.0–36.0)
MCV: 84.3 fL (ref 80.0–100.0)
Monocytes Absolute: 0.6 10*3/uL (ref 0.1–1.0)
Monocytes Relative: 5 %
Neutro Abs: 9.4 10*3/uL — ABNORMAL HIGH (ref 1.7–7.7)
Neutrophils Relative %: 72 %
Platelet Count: 364 10*3/uL (ref 150–400)
RBC: 5.54 MIL/uL — ABNORMAL HIGH (ref 3.87–5.11)
RDW: 14.5 % (ref 11.5–15.5)
WBC Count: 13.1 10*3/uL — ABNORMAL HIGH (ref 4.0–10.5)
nRBC: 0 % (ref 0.0–0.2)

## 2024-02-22 LAB — C-REACTIVE PROTEIN: CRP: 1.6 mg/dL — ABNORMAL HIGH

## 2024-02-22 LAB — FERRITIN: Ferritin: 384 ng/mL — ABNORMAL HIGH (ref 11–307)

## 2024-02-22 LAB — SEDIMENTATION RATE: Sed Rate: 16 mm/h (ref 0–22)

## 2024-02-23 LAB — ERYTHROPOIETIN: Erythropoietin: 13.3 m[IU]/mL (ref 2.6–18.5)
